# Patient Record
Sex: Female | Born: 1940 | Race: White | Hispanic: No | Marital: Married | Smoking: Never smoker
Health system: Southern US, Community
[De-identification: ages and names within clinical notes are randomized; demographics above are authoritative.]

## PROBLEM LIST (undated history)

## (undated) DIAGNOSIS — E7800 Pure hypercholesterolemia, unspecified: Secondary | ICD-10-CM

## (undated) DIAGNOSIS — H547 Unspecified visual loss: Secondary | ICD-10-CM

## (undated) DIAGNOSIS — I10 Essential (primary) hypertension: Secondary | ICD-10-CM

## (undated) HISTORY — DX: Pure hypercholesterolemia, unspecified: E78.00

## (undated) HISTORY — PX: TOTAL HIP ARTHROPLASTY: SHX124

## (undated) HISTORY — DX: Unspecified visual loss: H54.7

---

## 2017-08-31 ENCOUNTER — Ambulatory Visit (INDEPENDENT_AMBULATORY_CARE_PROVIDER_SITE_OTHER): Payer: Medicare Other | Admitting: Podiatry

## 2017-08-31 ENCOUNTER — Encounter: Payer: Self-pay | Admitting: Podiatry

## 2017-08-31 VITALS — BP 145/66 | HR 63

## 2017-08-31 DIAGNOSIS — M79675 Pain in left toe(s): Secondary | ICD-10-CM

## 2017-08-31 DIAGNOSIS — M79674 Pain in right toe(s): Secondary | ICD-10-CM

## 2017-08-31 DIAGNOSIS — L84 Corns and callosities: Secondary | ICD-10-CM

## 2017-08-31 DIAGNOSIS — B351 Tinea unguium: Secondary | ICD-10-CM

## 2017-08-31 NOTE — Progress Notes (Signed)
Complaint:  Visit Type: Patient presents  to my office for  preventative foot care services. Complaint: Patient states" my nails have grown long and thick and become painful to walk and wear shoes" She also needs her callus to be worked on.  She presents to the office coming from [place] and says she states her podiatrist every 9 weeks.   The patient presents for preventative foot care services.   Podiatric Exam: Vascular: dorsalis pedis and posterior tibial pulses are palpable bilateral. Capillary return is immediate. Temperature gradient is WNL. Skin turgor WNL  Sensorium: Normal Semmes Weinstein monofilament test. Normal tactile sensation bilaterally. Nail Exam: Pt has thick disfigured discolored nails with subungual debris noted bilateral entire nail hallux  Ulcer Exam: There is no evidence of ulcer or pre-ulcerative changes or infection. Orthopedic Exam: Muscle tone and strength are WNL. No limitations in general ROM. No crepitus or effusions noted. Foot type and digits show no abnormalities. Bony prominences are unremarkable. Skin: No Porokeratosis. No infection or ulcers.  Heel callus noted asymptomatic.  Diagnosis:  Onychomycosis, , Pain in right toe, pain in left toes  Treatment & Plan Procedures and Treatment: IE  Debride nails  X 2.  Debride heel callus with dremel.  Return Visit-Office Procedure: Patient instructed to return to the office for a follow up visit 10 weeks for continued evaluation and treatment. Patient will be reappointed with her husband.    Gardiner Barefoot DPM

## 2017-11-09 ENCOUNTER — Ambulatory Visit: Payer: BLUE CROSS/BLUE SHIELD | Admitting: Podiatry

## 2017-11-29 ENCOUNTER — Ambulatory Visit (INDEPENDENT_AMBULATORY_CARE_PROVIDER_SITE_OTHER): Payer: Medicare Other | Admitting: Podiatry

## 2017-11-29 ENCOUNTER — Encounter: Payer: Self-pay | Admitting: Podiatry

## 2017-11-29 DIAGNOSIS — M79675 Pain in left toe(s): Secondary | ICD-10-CM | POA: Diagnosis not present

## 2017-11-29 DIAGNOSIS — Q828 Other specified congenital malformations of skin: Secondary | ICD-10-CM | POA: Diagnosis not present

## 2017-11-29 DIAGNOSIS — M79674 Pain in right toe(s): Secondary | ICD-10-CM | POA: Diagnosis not present

## 2017-11-29 DIAGNOSIS — B351 Tinea unguium: Secondary | ICD-10-CM

## 2017-11-29 NOTE — Progress Notes (Signed)
She presents today placed on my schedule by Dr. Sharyon Cable with a chief complaint of painful elongated toenails.  Objective: Toenails are long thick yellow dystrophic-like mycotic and painful on palpation as well as multiple calluses plantar aspect of the forefoot and rear foot.  Assessment: Pain in limb secondary to porokeratosis and calluses as well as pain in limb secondary to onychomycosis.  Plan: Debridement of toenails and calluses bilateral.

## 2018-02-07 ENCOUNTER — Ambulatory Visit: Payer: BLUE CROSS/BLUE SHIELD | Admitting: Podiatry

## 2018-03-06 ENCOUNTER — Encounter: Payer: Self-pay | Admitting: Obstetrics and Gynecology

## 2018-03-07 ENCOUNTER — Encounter: Payer: Self-pay | Admitting: Obstetrics and Gynecology

## 2018-03-07 ENCOUNTER — Ambulatory Visit (INDEPENDENT_AMBULATORY_CARE_PROVIDER_SITE_OTHER): Payer: Medicare Other | Admitting: Obstetrics and Gynecology

## 2018-03-07 ENCOUNTER — Other Ambulatory Visit: Payer: Self-pay | Admitting: Obstetrics and Gynecology

## 2018-03-07 VITALS — BP 160/74 | HR 68 | Ht 62.0 in | Wt 194.9 lb

## 2018-03-07 DIAGNOSIS — Z1231 Encounter for screening mammogram for malignant neoplasm of breast: Secondary | ICD-10-CM

## 2018-03-07 DIAGNOSIS — Z780 Asymptomatic menopausal state: Secondary | ICD-10-CM

## 2018-03-07 DIAGNOSIS — Z01419 Encounter for gynecological examination (general) (routine) without abnormal findings: Secondary | ICD-10-CM

## 2018-03-07 DIAGNOSIS — N393 Stress incontinence (female) (male): Secondary | ICD-10-CM

## 2018-03-07 DIAGNOSIS — E668 Other obesity: Secondary | ICD-10-CM

## 2018-03-07 NOTE — Patient Instructions (Signed)
Health Maintenance for Postmenopausal Women Menopause is a normal process in which your reproductive ability comes to an end. This process happens gradually over a span of months to years, usually between the ages of 62 and 89. Menopause is complete when you have missed 12 consecutive menstrual periods. It is important to talk with your health care provider about some of the most common conditions that affect postmenopausal women, such as heart disease, cancer, and bone loss (osteoporosis). Adopting a healthy lifestyle and getting preventive care can help to promote your health and wellness. Those actions can also lower your chances of developing some of these common conditions. What should I know about menopause? During menopause, you may experience a number of symptoms, such as:  Moderate-to-severe hot flashes.  Night sweats.  Decrease in sex drive.  Mood swings.  Headaches.  Tiredness.  Irritability.  Memory problems.  Insomnia. Choosing to treat or not to treat menopausal changes is an individual decision that you make with your health care provider. What should I know about hormone replacement therapy and supplements? Hormone therapy products are effective for treating symptoms that are associated with menopause, such as hot flashes and night sweats. Hormone replacement carries certain risks, especially as you become older. If you are thinking about using estrogen or estrogen with progestin treatments, discuss the benefits and risks with your health care provider. What should I know about heart disease and stroke? Heart disease, heart attack, and stroke become more likely as you age. This may be due, in part, to the hormonal changes that your body experiences during menopause. These can affect how your body processes dietary fats, triglycerides, and cholesterol. Heart attack and stroke are both medical emergencies. There are many things that you can do to help prevent heart disease  and stroke:  Have your blood pressure checked at least every 1-2 years. High blood pressure causes heart disease and increases the risk of stroke.  If you are 79-72 years old, ask your health care provider if you should take aspirin to prevent a heart attack or a stroke.  Do not use any tobacco products, including cigarettes, chewing tobacco, or electronic cigarettes. If you need help quitting, ask your health care provider.  It is important to eat a healthy diet and maintain a healthy weight. ? Be sure to include plenty of vegetables, fruits, low-fat dairy products, and lean protein. ? Avoid eating foods that are high in solid fats, added sugars, or salt (sodium).  Get regular exercise. This is one of the most important things that you can do for your health. ? Try to exercise for at least 150 minutes each week. The type of exercise that you do should increase your heart rate and make you sweat. This is known as moderate-intensity exercise. ? Try to do strengthening exercises at least twice each week. Do these in addition to the moderate-intensity exercise.  Know your numbers.Ask your health care provider to check your cholesterol and your blood glucose. Continue to have your blood tested as directed by your health care provider.  What should I know about cancer screening? There are several types of cancer. Take the following steps to reduce your risk and to catch any cancer development as early as possible. Breast Cancer  Practice breast self-awareness. ? This means understanding how your breasts normally appear and feel. ? It also means doing regular breast self-exams. Let your health care provider know about any changes, no matter how small.  If you are 40 or  older, have a clinician do a breast exam (clinical breast exam or CBE) every year. Depending on your age, family history, and medical history, it may be recommended that you also have a yearly breast X-ray (mammogram).  If you  have a family history of breast cancer, talk with your health care provider about genetic screening.  If you are at high risk for breast cancer, talk with your health care provider about having an MRI and a mammogram every year.  Breast cancer (BRCA) gene test is recommended for women who have family members with BRCA-related cancers. Results of the assessment will determine the need for genetic counseling and BRCA1 and for BRCA2 testing. BRCA-related cancers include these types: ? Breast. This occurs in males or females. ? Ovarian. ? Tubal. This may also be called fallopian tube cancer. ? Cancer of the abdominal or pelvic lining (peritoneal cancer). ? Prostate. ? Pancreatic. Cervical, Uterine, and Ovarian Cancer Your health care provider may recommend that you be screened regularly for cancer of the pelvic organs. These include your ovaries, uterus, and vagina. This screening involves a pelvic exam, which includes checking for microscopic changes to the surface of your cervix (Pap test).  For women ages 21-65, health care providers may recommend a pelvic exam and a Pap test every three years. For women ages 39-65, they may recommend the Pap test and pelvic exam, combined with testing for human papilloma virus (HPV), every five years. Some types of HPV increase your risk of cervical cancer. Testing for HPV may also be done on women of any age who have unclear Pap test results.  Other health care providers may not recommend any screening for nonpregnant women who are considered low risk for pelvic cancer and have no symptoms. Ask your health care provider if a screening pelvic exam is right for you.  If you have had past treatment for cervical cancer or a condition that could lead to cancer, you need Pap tests and screening for cancer for at least 20 years after your treatment. If Pap tests have been discontinued for you, your risk factors (such as having a new sexual partner) need to be reassessed  to determine if you should start having screenings again. Some women have medical problems that increase the chance of getting cervical cancer. In these cases, your health care provider may recommend that you have screening and Pap tests more often.  If you have a family history of uterine cancer or ovarian cancer, talk with your health care provider about genetic screening.  If you have vaginal bleeding after reaching menopause, tell your health care provider.  There are currently no reliable tests available to screen for ovarian cancer. Lung Cancer Lung cancer screening is recommended for adults 57-50 years old who are at high risk for lung cancer because of a history of smoking. A yearly low-dose CT scan of the lungs is recommended if you:  Currently smoke.  Have a history of at least 30 pack-years of smoking and you currently smoke or have quit within the past 15 years. A pack-year is smoking an average of one pack of cigarettes per day for one year. Yearly screening should:  Continue until it has been 15 years since you quit.  Stop if you develop a health problem that would prevent you from having lung cancer treatment. Colorectal Cancer  This type of cancer can be detected and can often be prevented.  Routine colorectal cancer screening usually begins at age 12 and continues through  age 75.  If you have risk factors for colon cancer, your health care provider may recommend that you be screened at an earlier age.  If you have a family history of colorectal cancer, talk with your health care provider about genetic screening.  Your health care provider may also recommend using home test kits to check for hidden blood in your stool.  A small camera at the end of a tube can be used to examine your colon directly (sigmoidoscopy or colonoscopy). This is done to check for the earliest forms of colorectal cancer.  Direct examination of the colon should be repeated every 5-10 years until  age 75. However, if early forms of precancerous polyps or small growths are found or if you have a family history or genetic risk for colorectal cancer, you may need to be screened more often. Skin Cancer  Check your skin from head to toe regularly.  Monitor any moles. Be sure to tell your health care provider: ? About any new moles or changes in moles, especially if there is a change in a mole's shape or color. ? If you have a mole that is larger than the size of a pencil eraser.  If any of your family members has a history of skin cancer, especially at a young age, talk with your health care provider about genetic screening.  Always use sunscreen. Apply sunscreen liberally and repeatedly throughout the day.  Whenever you are outside, protect yourself by wearing long sleeves, pants, a wide-brimmed hat, and sunglasses. What should I know about osteoporosis? Osteoporosis is a condition in which bone destruction happens more quickly than new bone creation. After menopause, you may be at an increased risk for osteoporosis. To help prevent osteoporosis or the bone fractures that can happen because of osteoporosis, the following is recommended:  If you are 19-50 years old, get at least 1,000 mg of calcium and at least 600 mg of vitamin D per day.  If you are older than age 50 but younger than age 70, get at least 1,200 mg of calcium and at least 600 mg of vitamin D per day.  If you are older than age 70, get at least 1,200 mg of calcium and at least 800 mg of vitamin D per day. Smoking and excessive alcohol intake increase the risk of osteoporosis. Eat foods that are rich in calcium and vitamin D, and do weight-bearing exercises several times each week as directed by your health care provider. What should I know about how menopause affects my mental health? Depression may occur at any age, but it is more common as you become older. Common symptoms of depression include:  Low or sad  mood.  Changes in sleep patterns.  Changes in appetite or eating patterns.  Feeling an overall lack of motivation or enjoyment of activities that you previously enjoyed.  Frequent crying spells. Talk with your health care provider if you think that you are experiencing depression. What should I know about immunizations? It is important that you get and maintain your immunizations. These include:  Tetanus, diphtheria, and pertussis (Tdap) booster vaccine.  Influenza every year before the flu season begins.  Pneumonia vaccine.  Shingles vaccine. Your health care provider may also recommend other immunizations. This information is not intended to replace advice given to you by your health care provider. Make sure you discuss any questions you have with your health care provider. Document Released: 04/29/2005 Document Revised: 09/25/2015 Document Reviewed: 12/09/2014 Elsevier Interactive Patient Education    2019 [place].

## 2018-03-07 NOTE — Progress Notes (Signed)
ANNUAL PREVENTATIVE CARE GYNECOLOGY  ENCOUNTER NOTE  Subjective:       Cassandra Gill is a 77 y.o. 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 She has relocated from [place] to be closer to family. The patient is not sexually active. The patient is not taking hormone replacement therapy. Patient denies post-menopausal vaginal bleeding. The patient wears seatbelts: yes. The patient participates in regular exercise: no. Has the patient ever been transfused or tattooed?: no. The patient reports that there is not domestic violence in her life.  Current complaints: 1.  None    Gynecologic History No LMP recorded. Patient is postmenopausal. Contraception: post menopausal status Last Pap: last year, per patient. Results were: normal Last mammogram: last year,  per patient. Results were: normal Last Colonoscopy: several years ago (does not report more than 10 years has passed), was normal per patient.  Last Dexa Scan: reports it is up to date but cannot recall year performed.    Obstetric History OB History  Gravida Para Term Preterm AB Living  2 2 2     2   SAB TAB Ectopic Multiple Live Births          2    # Outcome Date GA Lbr Len/2nd Weight Sex Delivery Anes PTL Lv  2 Term 1965    F Vag-Spont   LIV  1 Term 4    F Vag-Spont   LIV    Past Medical History:  Diagnosis Date  . Blind   . High cholesterol     History reviewed. No pertinent family history.  History reviewed. No pertinent surgical history.  Social History   Socioeconomic History  . Marital status: Married    Spouse name: Not on file  . Number of children: Not on file  . Years of education: Not on file  . Highest education level: Not on file  Occupational History  . Not on file  Social Needs  . Financial resource strain: Not on file  . Food insecurity:    Worry: Not on file    Inability: Not on file  . Transportation needs:    Medical: Not on file    Non-medical: Not on file  Tobacco Use  . Smoking status: Never Smoker  . Smokeless tobacco: Never Used  Substance and Sexual Activity  . Alcohol use: Yes    Frequency: Never    Comment: socially  . Drug use: Never  . Sexual activity: Not Currently  Lifestyle  . Physical activity:    Days per week: Not on file    Minutes per session: Not on file  . Stress: Not on file  Relationships  . Social connections:    Talks on phone: Not on file    Gets together: Not on file    Attends religious service: Not on file    Active member of club or organization: Not on file    Attends meetings of clubs or organizations: Not on file    Relationship status: Not on file  . Intimate partner violence:    Fear of current or ex partner: Not on file    Emotionally abused: Not on file    Physically abused: Not on file    Forced sexual activity: Not on file  Other Topics Concern  . Not on file  Social History Narrative  . Not on file    Current Outpatient Medications on File Prior to Visit  Medication Sig Dispense  Refill  . simvastatin (ZOCOR) 20 MG tablet Take 20 mg by mouth daily.     No current facility-administered medications on file prior to visit.     No Known Allergies    Review of Systems ROS Review of Systems - General ROS: negative for - chills, fatigue, fever, hot flashes, night sweats, weight gain or weight loss Psychological ROS: negative for - anxiety, decreased libido, depression, mood swings, physical abuse or sexual abuse Ophthalmic ROS: negative for - blurry vision, eye pain or loss of vision ENT ROS: negative for - headaches, hearing change, visual changes or vocal changes Allergy and Immunology ROS: negative for - hives, itchy/watery eyes or seasonal allergies Hematological and Lymphatic ROS: negative for - bleeding problems, bruising, swollen lymph nodes or weight loss Endocrine ROS: negative for - galactorrhea, hair pattern changes, hot flashes, malaise/lethargy,  mood swings, palpitations, polydipsia/polyuria, skin changes, temperature intolerance or unexpected weight changes Breast ROS: negative for - new or changing breast lumps or nipple discharge Respiratory ROS: negative for - cough or shortness of breath Cardiovascular ROS: negative for - chest pain, irregular heartbeat, palpitations or shortness of breath Gastrointestinal ROS: no abdominal pain, change in bowel habits, or black or bloody stools Genito-Urinary ROS: no dysuria, trouble voiding, or hematuria.  Does report occasional urinary leakage with coughing, sneezing.  Musculoskeletal ROS: negative for - joint pain or joint stiffness Neurological ROS: negative for - bowel and bladder control changes Dermatological ROS: negative for rash and skin lesion changes   Objective:   BP (!) 160/74   Pulse 68   Ht 5\' 2"  (1.575 m)   Wt 194 lb 14.4 oz (88.4 kg)   BMI 35.65 kg/m  CONSTITUTIONAL: Well-developed, well-nourished female in no acute distress.  PSYCHIATRIC: Normal mood and affect. Normal behavior. Normal judgment and thought content. [place]: Alert and oriented to person, place, and time. Normal muscle tone coordination. No cranial nerve deficit noted. HENT:  Normocephalic, atraumatic, External right and left ear normal. Oropharynx is clear and moist EYES: Conjunctivae and EOM are normal. Pupils are equal, round, and reactive to light. No scleral icterus.  NECK: Normal range of motion, supple, no masses.  Normal thyroid.  SKIN: Skin is warm and dry. No rash noted. Not diaphoretic. No erythema. No pallor. CARDIOVASCULAR: Normal heart rate noted, regular rhythm, no murmur. RESPIRATORY: Clear to auscultation bilaterally. Effort and breath sounds normal, no problems with respiration noted. BREASTS: Symmetric in size. No masses, skin changes, nipple drainage, or lymphadenopathy. ABDOMEN: Soft, normal bowel sounds, no distention noted.  No tenderness, rebound or guarding.  BLADDER:  Normal PELVIC:  Bladder no bladder distension noted  Urethra: normal appearing urethra with no masses, tenderness or lesions  Vulva: normal appearing vulva with no masses, tenderness or lesions  Vagina: declined speculum  Exam but no masses present.   Cervix: declined speculum exam. But no CMT palpable.   Uterus: uterus is normal size, shape, consistency and nontender  Adnexa: normal adnexa in size, nontender and no masses  RV: External Exam NormaI, No Rectal Masses and Normal Sphincter tone  MUSCULOSKELETAL: Normal range of motion. No tenderness.  No cyanosis, clubbing, or edema.  2+ distal pulses. LYMPHATIC: No Axillary, Supraclavicular, or Inguinal Adenopathy.   Labs: No results found for: WBC, HGB, HCT, MCV, PLT  No results found for: CREATININE, BUN, NA, K, CL, CO2  No results found for: ALT, AST, GGT, ALKPHOS, BILITOT  No results found for: CHOL, HDL, LDLCALC, LDLDIRECT, TRIG, CHOLHDL  No results found for: TSH  No results found for: HGBA1C   Assessment:   Annual gynecologic examination 77 y.o. Moderate obesity Mild stress incontinence Menopausal  Plan:  Pap: Not needed.  Patient is beyond age of requiring pap smears (age 24).   Contraception: post menopausal status Mammogram: Not Indicated as patient is beyond age of screening age 29) with no risk factors.  Stool Guaiac Testing:  Not Ordered. Per patient, has been less than 10 years since colonoscopy.  Labs: Reports labs have recently performed with a PCP she has established in the area.  Routine preventative health maintenance measures emphasized: Exercise/Diet/Weight control, Tobacco Warnings, Alcohol/Substance use risks and Stress Management Mild urinary stress incontinence. Patient notes that symptoms are "just a part of life", not very bothersome.   Declines flu vaccine.  Return to [place], MD  [facility]

## 2018-03-07 NOTE — Progress Notes (Signed)
Pt is present today as a NP for an annual exam. Pt stated that she was doing well no complaints.

## 2018-03-10 DIAGNOSIS — Z780 Asymptomatic menopausal state: Secondary | ICD-10-CM | POA: Insufficient documentation

## 2018-03-10 DIAGNOSIS — N393 Stress incontinence (female) (male): Secondary | ICD-10-CM | POA: Insufficient documentation

## 2018-03-10 DIAGNOSIS — E668 Other obesity: Secondary | ICD-10-CM | POA: Insufficient documentation

## 2018-03-10 DIAGNOSIS — E669 Obesity, unspecified: Secondary | ICD-10-CM | POA: Insufficient documentation

## 2018-04-04 ENCOUNTER — Ambulatory Visit
Admission: RE | Admit: 2018-04-04 | Discharge: 2018-04-04 | Disposition: A | Discharge status: Home / Self Care | Payer: Medicare Other | Source: Ambulatory Visit | Attending: Obstetrics and Gynecology | Admitting: Obstetrics and Gynecology

## 2018-04-04 DIAGNOSIS — Z1231 Encounter for screening mammogram for malignant neoplasm of breast: Secondary | ICD-10-CM | POA: Insufficient documentation

## 2018-05-18 ENCOUNTER — Other Ambulatory Visit: Payer: Self-pay | Admitting: Internal Medicine

## 2018-05-18 DIAGNOSIS — R0989 Other specified symptoms and signs involving the circulatory and respiratory systems: Secondary | ICD-10-CM

## 2018-05-25 ENCOUNTER — Ambulatory Visit
Admission: RE | Admit: 2018-05-25 | Discharge: 2018-05-25 | Disposition: A | Discharge status: Home / Self Care | Payer: Medicare Other | Source: Ambulatory Visit | Attending: Internal Medicine | Admitting: Internal Medicine

## 2018-05-25 ENCOUNTER — Other Ambulatory Visit: Payer: Self-pay

## 2018-05-25 ENCOUNTER — Ambulatory Visit: Payer: PRIVATE HEALTH INSURANCE

## 2018-05-25 DIAGNOSIS — R0989 Other specified symptoms and signs involving the circulatory and respiratory systems: Secondary | ICD-10-CM | POA: Insufficient documentation

## 2019-03-04 ENCOUNTER — Other Ambulatory Visit: Payer: Self-pay | Admitting: Obstetrics and Gynecology

## 2019-03-13 ENCOUNTER — Encounter: Payer: Self-pay | Admitting: Obstetrics and Gynecology

## 2019-03-13 ENCOUNTER — Ambulatory Visit (INDEPENDENT_AMBULATORY_CARE_PROVIDER_SITE_OTHER): Payer: Medicare Other | Admitting: Obstetrics and Gynecology

## 2019-03-13 ENCOUNTER — Other Ambulatory Visit: Payer: Self-pay

## 2019-03-13 VITALS — BP 145/68 | HR 73 | Ht 62.0 in | Wt 190.4 lb

## 2019-03-13 DIAGNOSIS — Z01419 Encounter for gynecological examination (general) (routine) without abnormal findings: Secondary | ICD-10-CM

## 2019-03-13 DIAGNOSIS — Z780 Asymptomatic menopausal state: Secondary | ICD-10-CM

## 2019-03-13 DIAGNOSIS — E668 Other obesity: Secondary | ICD-10-CM

## 2019-03-13 DIAGNOSIS — N393 Stress incontinence (female) (male): Secondary | ICD-10-CM | POA: Diagnosis not present

## 2019-03-13 NOTE — Progress Notes (Signed)
ANNUAL PREVENTATIVE CARE GYNECOLOGY  ENCOUNTER NOTE  Subjective:       Cassandra Gill is a 78 y.o. 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 The patient is not sexually active. The patient is not taking hormone replacement therapy. Patient denies post-menopausal vaginal bleeding. The patient wears seatbelts: yes. The patient participates in regular exercise: no. Has the patient ever been transfused or tattooed?: no.    Current complaints: 1.  None    Gynecologic History No LMP recorded. Patient is postmenopausal. Contraception: post menopausal status.  Last Pap: last year, per patient. Results were: normal.  Last mammogram: 04/04/2018.  Results were: normal.  Last Colonoscopy: 2013.  Results were normal.  Last Dexa Scan: reports it is up to date but cannot recall year performed.    Obstetric History OB History  Gravida Para Term Preterm AB Living  2 2 2     2   SAB TAB Ectopic Multiple Live Births          2    # Outcome Date GA Lbr Len/2nd Weight Sex Delivery Anes PTL Lv  2 Term 1965    F Vag-Spont   LIV  1 Term 58    F Vag-Spont   LIV    Past Medical History:  Diagnosis Date  . Blind   . High cholesterol     History reviewed. No pertinent family history.  History reviewed. No pertinent surgical history.  Social History   Socioeconomic History  . Marital status: Married    Spouse name: Not on file  . Number of children: Not on file  . Years of education: Not on file  . Highest education level: Not on file  Occupational History  . Not on file  Tobacco Use  . Smoking status: Never Smoker  . Smokeless tobacco: Never Used  Substance and Sexual Activity  . Alcohol use: Yes    Comment: socially/occass  . Drug use: Never  . Sexual activity: Not Currently  Other Topics Concern  . Not on file  Social History Narrative  . Not on file   Social Determinants of Health   Financial Resource Strain:   .  Difficulty of Paying Living Expenses: Not on file  Food Insecurity:   . Worried About Charity fundraiser in the Last Year: Not on file  . Ran Out of Food in the Last Year: Not on file  Transportation Needs:   . Lack of Transportation (Medical): Not on file  . Lack of Transportation (Non-Medical): Not on file  Physical Activity:   . Days of Exercise per Week: Not on file  . Minutes of Exercise per Session: Not on file  Stress:   . Feeling of Stress : Not on file  Social Connections:   . Frequency of Communication with Friends and Family: Not on file  . Frequency of Social Gatherings with Friends and Family: Not on file  . Attends Religious Services: Not on file  . Active Member of Clubs or Organizations: Not on file  . Attends Archivist Meetings: Not on file  . Marital Status: Not on file  Intimate Partner Violence:   . Fear of Current or Ex-Partner: Not on file  . Emotionally Abused: Not on file  . Physically Abused: Not on file  . Sexually Abused: Not on file    Current Outpatient Medications on File Prior to Visit  Medication Sig Dispense Refill  . simvastatin (ZOCOR) 20  MG tablet Take 20 mg by mouth daily.     No current facility-administered medications on file prior to visit.    No Known Allergies    Review of Systems ROS Review of Systems - General ROS: negative for - chills, fatigue, fever, hot flashes, night sweats, weight gain or weight loss Psychological ROS: negative for - anxiety, decreased libido, depression, mood swings, physical abuse or sexual abuse Ophthalmic ROS: negative for - blurry vision, eye pain or loss of vision ENT ROS: negative for - headaches, hearing change, visual changes or vocal changes Allergy and Immunology ROS: negative for - hives, itchy/watery eyes or seasonal allergies Hematological and Lymphatic ROS: negative for - bleeding problems, bruising, swollen lymph nodes or weight loss Endocrine ROS: negative for - galactorrhea,  hair pattern changes, hot flashes, malaise/lethargy, mood swings, palpitations, polydipsia/polyuria, skin changes, temperature intolerance or unexpected weight changes Breast ROS: negative for - new or changing breast lumps or nipple discharge Respiratory ROS: negative for - cough or shortness of breath Cardiovascular ROS: negative for - chest pain, irregular heartbeat, palpitations or shortness of breath Gastrointestinal ROS: no abdominal pain, change in bowel habits, or black or bloody stools Genito-Urinary ROS: no dysuria, trouble voiding, or hematuria.  Still report occasional urinary leakage with coughing, sneezing.  Wears urinary pad daily just in case.  Musculoskeletal ROS: negative for - joint pain or joint stiffness Neurological ROS: negative for - bowel and bladder control changes Dermatological ROS: negative for rash and skin lesion changes   Objective:   BP (!) 145/68   Pulse 73   Ht 5\' 2"  (1.575 m)   Wt 190 lb 6.4 oz (86.4 kg)   BMI 34.82 kg/m  CONSTITUTIONAL: Well-developed, well-nourished female in no acute distress. Mild obesity PSYCHIATRIC: Normal mood and affect. Normal behavior. Normal judgment and thought content. [place]: Alert and oriented to person, place, and time. Normal muscle tone coordination. No cranial nerve deficit noted. HENT:  Normocephalic, atraumatic, External right and left ear normal. Oropharynx is clear and moist EYES: Conjunctivae and EOM are normal. Pupils are equal, round, and reactive to light. No scleral icterus.  NECK: Normal range of motion, supple, no masses.  Normal thyroid.  SKIN: Skin is warm and dry. No rash noted. Not diaphoretic. No erythema. No pallor. CARDIOVASCULAR: Normal heart rate noted, regular rhythm, no murmur. RESPIRATORY: Clear to auscultation bilaterally. Effort and breath sounds normal, no problems with respiration noted. BREASTS: Symmetric in size. No masses, skin changes, nipple drainage, or lymphadenopathy. ABDOMEN:  Soft, normal bowel sounds, no distention noted.  No tenderness, rebound or guarding.  BLADDER: Normal PELVIC:  Bladder no bladder distension noted  Urethra: normal appearing urethra with no masses, tenderness or lesions  Vulva: normal appearing vulva with no masses, tenderness or lesions  Vagina: declined speculum  Exam but no masses present.   Cervix: declined speculum exam. But no CMT palpable.   Uterus: uterus is normal size, shape, consistency and nontender  Adnexa: normal adnexa in size, nontender and no masses  RV: External Exam NormaI, No Rectal Masses and Normal Sphincter tone  MUSCULOSKELETAL: Normal range of motion. No tenderness.  No cyanosis, clubbing, or edema.  2+ distal pulses. LYMPHATIC: No Axillary, Supraclavicular, or Inguinal Adenopathy.   Labs: No results found for: WBC, HGB, HCT, MCV, PLT  No results found for: CREATININE, BUN, NA, K, CL, CO2  No results found for: ALT, AST, GGT, ALKPHOS, BILITOT  No results found for: CHOL, HDL, LDLCALC, LDLDIRECT, TRIG, CHOLHDL  No results found  for: TSH  No results found for: HGBA1C   Assessment:   Annual gynecologic examination 78 y.o. Moderate obesity Mild stress incontinence Menopausal  Plan:  Pap: Not needed.  Patient is beyond age of requiring pap smears (age 63).   Contraception: post menopausal status Mammogram: Not Indicated as patient is beyond age of screening age (43) with no risk factors.  Stool Guaiac Testing:  Not Ordered. Up to date.  Labs: Labs are done by PCP Routine preventative health maintenance measures emphasized: Exercise/Diet/Weight control, Tobacco Warnings, Alcohol/Substance use risks and Stress Management Mild urinary stress incontinence. Wears urinary pads. Continues to decline any intervention. Declines flu vaccine.  Return to [place], MD  [facility]

## 2019-03-13 NOTE — Progress Notes (Signed)
Pt present for annual exam. Pt stated that she was doing well no problems at this time.  Pt had flu vaccine 01/25/19.

## 2019-03-13 NOTE — Patient Instructions (Signed)
Health Maintenance for Postmenopausal Women Menopause is a normal process in which your ability to get pregnant comes to an end. This process happens slowly over many months or years, usually between the ages of 48 and 55. Menopause is complete when you have missed your menstrual periods for 12 months. It is important to talk with your health care provider about some of the most common conditions that affect women after menopause (postmenopausal women). These include heart disease, cancer, and bone loss (osteoporosis). Adopting a healthy lifestyle and getting preventive care can help to promote your health and wellness. The actions you take can also lower your chances of developing some of these common conditions. What should I know about menopause? During menopause, you may get a number of symptoms, such as:  Hot flashes. These can be moderate or severe.  Night sweats.  Decrease in sex drive.  Mood swings.  Headaches.  Tiredness.  Irritability.  Memory problems.  Insomnia. Choosing to treat or not to treat these symptoms is a decision that you make with your health care provider. Do I need hormone replacement therapy?  Hormone replacement therapy is effective in treating symptoms that are caused by menopause, such as hot flashes and night sweats.  Hormone replacement carries certain risks, especially as you become older. If you are thinking about using estrogen or estrogen with progestin, discuss the benefits and risks with your health care provider. What is my risk for heart disease and stroke? The risk of heart disease, heart attack, and stroke increases as you age. One of the causes may be a change in the body's hormones during menopause. This can affect how your body uses dietary fats, triglycerides, and cholesterol. Heart attack and stroke are medical emergencies. There are many things that you can do to help prevent heart disease and stroke. Watch your blood pressure  High  blood pressure causes heart disease and increases the risk of stroke. This is more likely to develop in people who have high blood pressure readings, are of African descent, or are overweight.  Have your blood pressure checked: ? Every 3-5 years if you are 18-39 years of age. ? Every year if you are 40 years old or older. Eat a healthy diet   Eat a diet that includes plenty of vegetables, fruits, low-fat dairy products, and lean protein.  Do not eat a lot of foods that are high in solid fats, added sugars, or sodium. Get regular exercise Get regular exercise. This is one of the most important things you can do for your health. Most adults should:  Try to exercise for at least 150 minutes each week. The exercise should increase your heart rate and make you sweat (moderate-intensity exercise).  Try to do strengthening exercises at least twice each week. Do these in addition to the moderate-intensity exercise.  Spend less time sitting. Even light physical activity can be beneficial. Other tips  Work with your health care provider to achieve or maintain a healthy weight.  Do not use any products that contain nicotine or tobacco, such as cigarettes, e-cigarettes, and chewing tobacco. If you need help quitting, ask your health care provider.  Know your numbers. Ask your health care provider to check your cholesterol and your blood sugar (glucose). Continue to have your blood tested as directed by your health care provider. Do I need screening for cancer? Depending on your health history and family history, you may need to have cancer screening at different stages of your life. This   may include screening for:  Breast cancer.  Cervical cancer.  Lung cancer.  Colorectal cancer. What is my risk for osteoporosis? After menopause, you may be at increased risk for osteoporosis. Osteoporosis is a condition in which bone destruction happens more quickly than new bone creation. To help prevent  osteoporosis or the bone fractures that can happen because of osteoporosis, you may take the following actions:  If you are 83-73 years old, get at least 1,000 mg of calcium and at least 600 mg of vitamin D per day.  If you are older than age 47 but younger than age 17, get at least 1,200 mg of calcium and at least 600 mg of vitamin D per day.  If you are older than age 54, get at least 1,200 mg of calcium and at least 800 mg of vitamin D per day. Smoking and drinking excessive alcohol increase the risk of osteoporosis. Eat foods that are rich in calcium and vitamin D, and do weight-bearing exercises several times each week as directed by your health care provider. How does menopause affect my mental health? Depression may occur at any age, but it is more common as you become older. Common symptoms of depression include:  Low or sad mood.  Changes in sleep patterns.  Changes in appetite or eating patterns.  Feeling an overall lack of motivation or enjoyment of activities that you previously enjoyed.  Frequent crying spells. Talk with your health care provider if you think that you are experiencing depression. General instructions See your health care provider for regular wellness exams and vaccines. This may include:  Scheduling regular health, dental, and eye exams.  Getting and maintaining your vaccines. These include: ? Influenza vaccine. Get this vaccine each year before the flu season begins. ? Pneumonia vaccine. ? Shingles vaccine. ? Tetanus, diphtheria, and pertussis (Tdap) booster vaccine. Your health care provider may also recommend other immunizations. Tell your health care provider if you have ever been abused or do not feel safe at home. Summary  Menopause is a normal process in which your ability to get pregnant comes to an end.  This condition causes hot flashes, night sweats, decreased interest in sex, mood swings, headaches, or lack of sleep.  Treatment for this  condition may include hormone replacement therapy.  Take actions to keep yourself healthy, including exercising regularly, eating a healthy diet, watching your weight, and checking your blood pressure and blood sugar levels.  Get screened for cancer and depression. Make sure that you are up to date with all your vaccines. This information is not intended to replace advice given to you by your health care provider. Make sure you discuss any questions you have with your health care provider. Document Released: 04/29/2005 Document Revised: 02/28/2018 Document Reviewed: 02/28/2018 Elsevier Patient Education  2020 [place] Breast self-awareness means being familiar with how your breasts look and feel. It involves checking your breasts regularly and reporting any changes to your health care provider. Practicing breast self-awareness is important. Sometimes changes may not be harmful (are benign), but sometimes a change in your breasts can be a sign of a serious medical problem. It is important to learn how to do this procedure correctly so that you can catch problems early, when treatment is more likely to be successful. All women should practice breast self-awareness, including women who have had breast implants. What you need:  A mirror.  A well-lit room. How to do a breast self-exam  A breast self-exam is one way to learn what is normal for your breasts and whether your breasts are changing. To do a breast self-exam: Look for changes  1. Remove all the clothing above your waist. 2. Stand in front of a mirror in a room with good lighting. 3. Put your hands on your hips. 4. Push your hands firmly downward. 5. Compare your breasts in the mirror. Look for differences between them (asymmetry), such as: ? Differences in shape. ? Differences in size. ? Puckers, dips, and bumps in one breast and not the other. 6. Look at each breast for changes in the skin, such  as: ? Redness. ? Scaly areas. 7. Look for changes in your nipples, such as: ? Discharge. ? Bleeding. ? Dimpling. ? Redness. ? A change in position. Feel for changes Carefully feel your breasts for lumps and changes. It is best to do this while lying on your back on the floor, and again while sitting or standing in the tub or shower with soapy water on your skin. Feel each breast in the following way: 1. Place the arm on the side of the breast you are examining above your head. 2. Feel your breast with the other hand. 3. Start in the nipple area and make -inch (2 cm) overlapping circles to feel your breast. Use the pads of your three middle fingers to do this. Apply light pressure, then medium pressure, then firm pressure. The light pressure will allow you to feel the tissue closest to the skin. The medium pressure will allow you to feel the tissue that is a little deeper. The firm pressure will allow you to feel the tissue close to the ribs. 4. Continue the overlapping circles, moving downward over the breast until you feel your ribs below your breast. 5. Move one finger-width toward the center of the body. Continue to use the -inch (2 cm) overlapping circles to feel your breast as you move slowly up toward your collarbone. 6. Continue the up-and-down exam using all three pressures until you reach your armpit.  Write down what you find Writing down what you find can help you remember what to discuss with your health care provider. Write down:  What is normal for each breast.  Any changes that you find in each breast, including: ? The kind of changes you find. ? Any pain or tenderness. ? Size and location of any lumps.  Where you are in your menstrual cycle, if you are still menstruating. General tips and recommendations  Examine your breasts every month.  If you are breastfeeding, the best time to examine your breasts is after a feeding or after using a breast pump.  If you  menstruate, the best time to examine your breasts is 5-7 days after your period. Breasts are generally lumpier during menstrual periods, and it may be more difficult to notice changes.  With time and practice, you will become more familiar with the variations in your breasts and more comfortable with the exam. Contact a health care provider if you:  See a change in the shape or size of your breasts or nipples.  See a change in the skin of your breast or nipples, such as a reddened or scaly area.  Have unusual discharge from your nipples.  Find a lump or thick area that was not there before.  Have pain in your breasts.  Have any concerns related to your breast health. Summary  Breast self-awareness includes looking for physical changes in  your breasts, as well as feeling for any changes within your breasts.  Breast self-awareness should be performed in front of a mirror in a well-lit room.  You should examine your breasts every month. If you menstruate, the best time to examine your breasts is 5-7 days after your menstrual period.  Let your health care provider know of any changes you notice in your breasts, including changes in size, changes on the skin, pain or tenderness, or unusual fluid from your nipples. This information is not intended to replace advice given to you by your health care provider. Make sure you discuss any questions you have with your health care provider. Document Released: 03/07/2005 Document Revised: 10/24/2017 Document Reviewed: 10/24/2017 Elsevier Patient Education  2020 [employer].

## 2019-03-18 ENCOUNTER — Other Ambulatory Visit: Payer: Self-pay | Admitting: Obstetrics and Gynecology

## 2019-03-18 DIAGNOSIS — Z1231 Encounter for screening mammogram for malignant neoplasm of breast: Secondary | ICD-10-CM

## 2019-04-11 ENCOUNTER — Telehealth: Payer: Self-pay | Admitting: Surgical

## 2019-04-11 NOTE — Telephone Encounter (Signed)
Patient called and stated that medicare called to let her know that her visit with Dr. Marcelline Mates on 03/13/2019 was denied. They are wanting her to file an appeal. Patient would like to know if codes could be changed to make it go through.

## 2019-04-18 NOTE — Telephone Encounter (Signed)
Pt aware all dx codes seem correct. Informed pt that medicare typically pays for [facility] every other year.   Pt is in the appeal process. She will call me back if I can help further.

## 2019-05-01 ENCOUNTER — Ambulatory Visit
Admission: RE | Admit: 2019-05-01 | Discharge: 2019-05-01 | Disposition: A | Discharge status: Home / Self Care | Payer: Medicare Other | Source: Ambulatory Visit | Attending: Obstetrics and Gynecology | Admitting: Obstetrics and Gynecology

## 2019-05-01 DIAGNOSIS — Z1231 Encounter for screening mammogram for malignant neoplasm of breast: Secondary | ICD-10-CM | POA: Insufficient documentation

## 2019-05-07 ENCOUNTER — Telehealth: Payer: Self-pay | Admitting: Obstetrics and Gynecology

## 2019-05-07 NOTE — Telephone Encounter (Signed)
Pt called in and stated that she wanted to know the results of her mammogram. Pt is requesting a  Call back from the nurse. Please advise

## 2019-05-23 NOTE — Telephone Encounter (Signed)
Pt called and is aware of test results.

## 2019-06-19 ENCOUNTER — Telehealth: Payer: Self-pay | Admitting: Obstetrics and Gynecology

## 2019-06-19 NOTE — Telephone Encounter (Signed)
Pt called in and stated that she was seen 03/13/2019. The pt insuance will not cover her visit because it was coded wrong. It needed to be coded as a MEDICARE  wellness exam. The pt is requesting a call back. Please advise

## 2019-06-20 NOTE — Telephone Encounter (Signed)
Medicare will not cover her AE. She is not sure why.  Will try and reach out to [facility] to get this changed.   Will ask to remove [facility] code.

## 2019-07-02 NOTE — Telephone Encounter (Signed)
Pt aware Gayleen Orem still working on this.  Will f/u with pt when I get more info.  Pt appreciative of call.

## 2019-08-12 NOTE — Telephone Encounter (Signed)
  Pt aware of Stacy's response below. Pt appreciative of the update.     Hi Murlean Seelye, Per coder review, I corrected [id number] to [phone] and resubmitted the claim back to Medicare.

## 2020-01-15 ENCOUNTER — Encounter
Admission: RE | Admit: 2020-01-15 | Discharge: 2020-01-15 | Disposition: A | Discharge status: Home / Self Care | Payer: Medicare Other | Source: Ambulatory Visit | Attending: Internal Medicine | Admitting: Internal Medicine

## 2020-01-22 ENCOUNTER — Encounter: Admission: RE | Payer: Self-pay | Source: Home / Self Care

## 2020-01-22 ENCOUNTER — Inpatient Hospital Stay: Admission: RE | Admit: 2020-01-22 | Payer: Medicare Other | Source: Home / Self Care | Admitting: Orthopedic Surgery

## 2020-01-22 SURGERY — ARTHROPLASTY, HIP, TOTAL, ANTERIOR APPROACH
Anesthesia: Spinal | Site: Hip | Laterality: Right

## 2020-01-25 DIAGNOSIS — Z96649 Presence of unspecified artificial hip joint: Secondary | ICD-10-CM | POA: Insufficient documentation

## 2020-03-18 ENCOUNTER — Encounter: Payer: Medicare Other | Admitting: Obstetrics and Gynecology

## 2020-03-25 ENCOUNTER — Other Ambulatory Visit: Payer: Self-pay | Admitting: Obstetrics and Gynecology

## 2020-03-25 ENCOUNTER — Other Ambulatory Visit: Payer: Self-pay | Admitting: Internal Medicine

## 2020-03-25 DIAGNOSIS — Z1231 Encounter for screening mammogram for malignant neoplasm of breast: Secondary | ICD-10-CM

## 2020-05-01 ENCOUNTER — Ambulatory Visit
Admission: RE | Admit: 2020-05-01 | Discharge: 2020-05-01 | Disposition: A | Discharge status: Home / Self Care | Payer: Medicare Other | Source: Ambulatory Visit | Attending: Internal Medicine | Admitting: Internal Medicine

## 2020-05-01 ENCOUNTER — Other Ambulatory Visit: Payer: Self-pay

## 2020-05-01 DIAGNOSIS — Z1231 Encounter for screening mammogram for malignant neoplasm of breast: Secondary | ICD-10-CM | POA: Insufficient documentation

## 2021-04-05 ENCOUNTER — Other Ambulatory Visit: Payer: Self-pay | Admitting: Obstetrics and Gynecology

## 2021-04-05 DIAGNOSIS — Z1231 Encounter for screening mammogram for malignant neoplasm of breast: Secondary | ICD-10-CM

## 2021-05-07 ENCOUNTER — Other Ambulatory Visit: Payer: Self-pay

## 2021-05-07 ENCOUNTER — Ambulatory Visit
Admission: RE | Admit: 2021-05-07 | Discharge: 2021-05-07 | Disposition: A | Discharge status: Home / Self Care | Payer: Medicare Other | Source: Ambulatory Visit | Attending: Obstetrics and Gynecology | Admitting: Obstetrics and Gynecology

## 2021-05-07 DIAGNOSIS — Z1231 Encounter for screening mammogram for malignant neoplasm of breast: Secondary | ICD-10-CM | POA: Insufficient documentation

## 2021-05-28 ENCOUNTER — Encounter: Payer: Medicare Other | Admitting: Obstetrics and Gynecology

## 2021-06-15 ENCOUNTER — Other Ambulatory Visit: Payer: Self-pay | Admitting: Internal Medicine

## 2021-06-15 ENCOUNTER — Other Ambulatory Visit (HOSPITAL_COMMUNITY): Payer: Self-pay | Admitting: Internal Medicine

## 2021-06-15 DIAGNOSIS — R0989 Other specified symptoms and signs involving the circulatory and respiratory systems: Secondary | ICD-10-CM

## 2021-06-29 ENCOUNTER — Ambulatory Visit
Admission: RE | Admit: 2021-06-29 | Discharge: 2021-06-29 | Disposition: A | Discharge status: Home / Self Care | Payer: Medicare Other | Source: Ambulatory Visit | Attending: Internal Medicine | Admitting: Internal Medicine

## 2021-06-29 DIAGNOSIS — R0989 Other specified symptoms and signs involving the circulatory and respiratory systems: Secondary | ICD-10-CM | POA: Insufficient documentation

## 2021-07-12 NOTE — Patient Instructions (Signed)
Breast Self-Awareness ?Breast self-awareness is knowing how your breasts look and feel. You need to: ?Check your breasts on a regular basis. ?Tell your doctor about any changes. ?Become familiar with the look and feel of your breasts. This can help you catch a breast problem while it is still small and can be treated. You should do breast self-exams even if you have breast implants. ?What you need: ?A mirror. ?A well-lit room. ?A pillow or other soft object. ?How to do a breast self-exam ?Follow these steps to do a breast self-exam: ?Look for changes ? ?Take off all the clothes above your waist. ?Stand in front of a mirror in a room with good lighting. ?Put your hands down at your sides. ?Compare your breasts in the mirror. Look for any difference between them, such as: ?A difference in shape. ?A difference in size. ?Wrinkles, dips, and bumps in one breast and not the other. ?Look at each breast for changes in the skin, such as: ?Redness. ?Scaly areas. ?Skin that has gotten thicker. ?Dimpling. ?Open sores (ulcers). ?Look for changes in your nipples, such as: ?Fluid coming out of a nipple. ?Fluid around a nipple. ?Bleeding. ?Dimpling. ?Redness. ?A nipple that looks pushed in (retracted), or that has changed position. ?Feel for changes ?Lie on your back. ?Feel each breast. To do this: ?Pick a breast to feel. ?Place a pillow under the shoulder closest to that breast. Put the arm closest to that breast behind your head. ?Feel the nipple area of that breast using the hand of your other arm. Feel the area with the pads of your three middle fingers by making small circles with your fingers. Use light, medium, and firm pressure. ?Continue the overlapping circles, moving downward over the breast. Keep making circles with your fingers. Stop when you feel your ribs. ?Start making circles with your fingers again, this time going upward until you reach your collarbone. ?Then, make circles outward across your breast and into your  armpit area. ?Squeeze your nipple. Check for discharge and lumps. ?Repeat these steps to check your other breast. ?Sit or stand in the tub or shower. ?With soapy water on your skin, feel each breast the same way you did when you were lying down. ?Write down what you find ?Writing down what you find can help you remember what to tell your doctor. Write down: ?What is normal for each breast. ?Any changes you find in each breast. These include: ?The kind of changes you find. ?A tender or painful breast. ?Any lump you find. Write down its size and where it is. ?When you last had your monthly period (menstrual cycle). ?General tips ?If you are breastfeeding, the best time to check your breasts is after you feed your baby or after you use a breast pump. ?If you get monthly bleeding, the best time to check your breasts is 5-7 days after your monthly cycle ends. ?With time, you will become comfortable with the self-exam. You will also start to know if there are changes in your breasts. ?Contact a doctor if: ?You see a change in the shape or size of your breasts or nipples. ?You see a change in the skin of your breast or nipples, such as red or scaly skin. ?You have fluid coming from your nipples that is not normal. ?You find a new lump or thick area. ?You have breast pain. ?You have any concerns about your breast health. ?Summary ?Breast self-awareness includes looking for changes in your breasts and feeling for changes   within your breasts. ?You should do breast self-awareness in front of a mirror in a well-lit room. ?If you get monthly periods (menstrual cycles), the best time to check your breasts is 5-7 days after your period ends. ?Tell your doctor about any changes you see in your breasts. Changes include changes in size, changes on the skin, painful or tender breasts, or fluid from your nipples that is not normal. ?This information is not intended to replace advice given to you by your health care provider. Make sure  you discuss any questions you have with your health care provider. ?Document Revised: 01/07/2021 Document Reviewed: 01/07/2021 ?Elsevier Patient Education ? [place]. ?Preventive Care 56 Years and Older, Female ?Preventive care refers to lifestyle choices and visits with your health care provider that can promote health and wellness. Preventive care visits are also called wellness exams. ?What can I expect for my preventive care visit? ?Counseling ?Your health care provider may ask you questions about your: ?Medical history, including: ?Past medical problems. ?Family medical history. ?Pregnancy and menstrual history. ?History of falls. ?Current health, including: ?Memory and ability to understand (cognition). ?Emotional well-being. ?Home life and relationship well-being. ?Sexual activity and sexual health. ?Lifestyle, including: ?Alcohol, nicotine or tobacco, and drug use. ?Access to firearms. ?Diet, exercise, and sleep habits. ?Work and work Statistician. ?Sunscreen use. ?Safety issues such as seatbelt and bike helmet use. ?Physical exam ?Your health care provider will check your: ?Height and weight. These may be used to calculate your BMI (body mass index). BMI is a measurement that tells if you are at a healthy weight. ?Waist circumference. This measures the distance around your waistline. This measurement also tells if you are at a healthy weight and may help predict your risk of certain diseases, such as type 2 diabetes and high blood pressure. ?Heart rate and blood pressure. ?Body temperature. ?Skin for abnormal spots. ?What immunizations do I need? ? ?Vaccines are usually given at various ages, according to a schedule. Your health care provider will recommend vaccines for you based on your age, medical history, and lifestyle or other factors, such as travel or where you work. ?What tests do I need? ?Screening ?Your health care provider may recommend screening tests for certain conditions. This may  include: ?Lipid and cholesterol levels. ?Hepatitis C test. ?Hepatitis B test. ?HIV (human immunodeficiency virus) test. ?STI (sexually transmitted infection) testing, if you are at risk. ?Lung cancer screening. ?Colorectal cancer screening. ?Diabetes screening. This is done by checking your blood sugar (glucose) after you have not eaten for a while (fasting). ?Mammogram. Talk with your health care provider about how often you should have regular mammograms. ?BRCA-related cancer screening. This may be done if you have a family history of breast, ovarian, tubal, or peritoneal cancers. ?Bone density scan. This is done to screen for osteoporosis. ?Talk with your health care provider about your test results, treatment options, and if necessary, the need for more tests. ?Follow these instructions at home: ?Eating and drinking ? ?Eat a diet that includes fresh fruits and vegetables, whole grains, lean protein, and low-fat dairy products. Limit your intake of foods with high amounts of sugar, saturated fats, and salt. ?Take vitamin and mineral supplements as recommended by your health care provider. ?Do not drink alcohol if your health care provider tells you not to drink. ?If you drink alcohol: ?Limit how much you have to 0-1 drink a day. ?Know how much alcohol is in your drink. In the [place] one drink equals one 12 oz  bottle of beer (355 mL), one 5 oz glass of wine (148 mL), or one 1? oz glass of hard liquor (44 mL). ?Lifestyle ?Brush your teeth every morning and night with fluoride toothpaste. Floss one time each day. ?Exercise for at least 30 minutes 5 or more days each week. ?Do not use any products that contain nicotine or tobacco. These products include cigarettes, chewing tobacco, and vaping devices, such as e-cigarettes. If you need help quitting, ask your health care provider. ?Do not use drugs. ?If you are sexually active, practice safe sex. Use a condom or other form of protection in order to prevent STIs. ?Take  aspirin only as told by your health care provider. Make sure that you understand how much to take and what form to take. Work with your health care provider to find out whether it is safe and beneficial

## 2021-07-12 NOTE — Progress Notes (Signed)
? ?ANNUAL PREVENTATIVE CARE GYNECOLOGY  ENCOUNTER NOTE ? ?Subjective:  ?    ? Cassandra Gill is a 81 y.o. G31P2002 female here for a routine annual gynecologic exam. The patient is not sexually active. The patient is not taking hormone replacement therapy. Patient denies post-menopausal vaginal bleeding. The patient wears seatbelts: yes. The patient participates in regular exercise: no. Has the patient ever been transfused or tattooed?: no. The patient reports that there is not domestic violence in her life. ? ?Current complaints: ?1.  None  ?  ?Gynecologic History ?No LMP recorded. Patient is postmenopausal. ?Contraception: post menopausal status ?Last Pap: 2019, per patient. Results were: normal ?Last mammogram: 04/04/2018. Results were: normal ?Last Colonoscopy: 2013.  Results were normal.  ?Last Dexa Scan:  reports it is up to date but cannot recall year performed.  ?  ? ? ?Obstetric History ?OB History  ?Gravida Para Term Preterm AB Living  ?'2 2 2     2  '$ ?SAB IAB Ectopic Multiple Live Births  ?        2  ?  ?# Outcome Date GA Lbr Len/2nd Weight Sex Delivery Anes PTL Lv  ?2 Term 80    F Vag-Spont   LIV  ?[place] ? ?Past Medical History:  ?Diagnosis Date  ? Blind   ? High cholesterol   ? ? ?Family History  ?Problem Relation Age of Onset  ? Breast cancer Maternal Aunt 54  ? Breast cancer Cousin   ?     maternal side <50  ? ? ?Past Surgical History:  ?Procedure Laterality Date  ? TOTAL HIP ARTHROPLASTY    ? ? ?Social History  ? ?Socioeconomic History  ? Marital status: Married  ?  Spouse name: Not on file  ? Number of children: Not on file  ? Years of education: Not on file  ? Highest education level: Not on file  ?Occupational History  ? Not on file  ?Tobacco Use  ? Smoking status: Never  ? Smokeless tobacco: Never  ?Vaping Use  ? Vaping Use: Never used  ?Substance and Sexual Activity  ? Alcohol use: Yes  ?  Comment: socially/occass  ? Drug use: Never  ? Sexual activity: Not Currently   ?Other Topics Concern  ? Not on file  ?Social History Narrative  ? Not on file  ? ?Social Determinants of Health  ? ?Financial Resource Strain: Not on file  ?Food Insecurity: Not on file  ?Transportation Needs: Not on file  ?Physical Activity: Not on file  ?Stress: Not on file  ?Social Connections: Not on file  ?Intimate Partner Violence: Not on file  ? ? ?Current Outpatient Medications on File Prior to Visit  ?Medication Sig Dispense Refill  ? simvastatin (ZOCOR) 20 MG tablet Take 20 mg by mouth daily.    ? ?No current facility-administered medications on file prior to visit.  ? ? ?No Known Allergies ? ? ? ?Review of Systems ?ROS ?Review of Systems - General ROS: negative for - chills, fatigue, fever, hot flashes, night sweats, weight gain or weight loss ?Psychological ROS: negative for - anxiety, decreased libido, depression, mood swings, physical abuse or sexual abuse ?Ophthalmic ROS: negative for - blurry vision, eye pain or loss of vision ?ENT ROS: negative for - headaches, hearing change, visual changes or vocal changes ?Allergy and Immunology ROS: negative for - hives, itchy/watery eyes or seasonal allergies ?Hematological and Lymphatic ROS: negative for - bleeding  problems, bruising, swollen lymph nodes or weight loss ?Endocrine ROS: negative for - galactorrhea, hair pattern changes, hot flashes, malaise/lethargy, mood swings, palpitations, polydipsia/polyuria, skin changes, temperature intolerance or unexpected weight changes ?Breast ROS: negative for - new or changing breast lumps or nipple discharge ?Respiratory ROS: negative for - cough or shortness of breath ?Cardiovascular ROS: negative for - chest pain, irregular heartbeat, palpitations or shortness of breath ?Gastrointestinal ROS: no abdominal pain, change in bowel habits, or black or bloody stools ?Genito-Urinary ROS: no dysuria, trouble voiding, or hematuria ?Musculoskeletal ROS: negative for - joint pain or joint stiffness ?Neurological ROS:  negative for - bowel and bladder control changes ?Dermatological ROS: negative for rash and skin lesion changes ?  ?Objective:  ? ?BP (!) 127/59   Pulse (!) 59   Resp 16   Ht '5\' 1"'$  (1.549 m)   Wt 186 lb (84.4 kg)   BMI 35.14 kg/m?  ?CONSTITUTIONAL: Well-developed, well-nourished female in no acute distress.  ?PSYCHIATRIC: Normal mood and affect. Normal behavior. Normal judgment and thought content. ?[place]: Alert and oriented to person, place, and time. Normal muscle tone coordination. No cranial nerve deficit noted. ?HENT:  Normocephalic, atraumatic, External right and left ear normal. Oropharynx is clear and moist ?EYES: Conjunctivae and EOM are normal. Pupils are equal, round, and reactive to light. No scleral icterus.  ?NECK: Normal range of motion, supple, no masses.  Normal thyroid.  ?SKIN: Skin is warm and dry. No rash noted. Not diaphoretic. No erythema. No pallor. Fleshy raised mass, likely subcutaneous on left lateral knee (likely lipoma, patient notes it has been there for ~ 15 years, no change) ?CARDIOVASCULAR: Normal heart rate noted, regular rhythm, no murmur. ?RESPIRATORY: Clear to auscultation bilaterally. Effort and breath sounds normal, no problems with respiration noted. ?BREASTS: Symmetric in size. No masses, skin changes, nipple drainage, or lymphadenopathy. ?ABDOMEN: Soft, normal bowel sounds, no distention noted.  No tenderness, rebound or guarding.  ?BLADDER: Normal ?PELVIC: Deferred  ?MUSCULOSKELETAL: Normal range of motion. No tenderness.  No cyanosis, clubbing, or edema.  2+ distal pulses. ?LYMPHATIC: No Axillary, Supraclavicular, or Inguinal Adenopathy. ? ? ?Labs: ?Labs performed by PCP ? ?Assessment:  ? ?1. Encounter for routine gynecologic examination in Medicare patient   ?2. Lipoadenoma   ?3. Stress incontinence, female   ? ? ?Plan:  ?Pap: Not needed ?Mammogram:  Up to date ?Stool Guaiac Testing:  Not Ordered ?Labs:  Barthol[place] Bill labs done by PCP ?Routine preventative health  maintenance measures emphasized: Exercise/Diet/Weight control, Tobacco Warnings, Alcohol/Substance use risks, Stress Management ?Stress incontinence, helped with Oxybutynin (although typically prescribed for urge) ?Lipoma asymptomatic, no change.  ?Advised on no further GYN screenings needed, has mammograms ordered by PCP. Up to date on all other screens. Can see GYN as needed.  ? ? ?Rubie Maid, MD ?[facility]  ? ? ? ? ? ? ? ? ? ? ? ?  ?

## 2021-07-13 ENCOUNTER — Ambulatory Visit (INDEPENDENT_AMBULATORY_CARE_PROVIDER_SITE_OTHER): Payer: Medicare Other | Admitting: Obstetrics and Gynecology

## 2021-07-13 ENCOUNTER — Encounter: Payer: Self-pay | Admitting: Obstetrics and Gynecology

## 2021-07-13 VITALS — BP 127/59 | HR 59 | Resp 16 | Ht 61.0 in | Wt 186.0 lb

## 2021-07-13 DIAGNOSIS — Z01419 Encounter for gynecological examination (general) (routine) without abnormal findings: Secondary | ICD-10-CM | POA: Diagnosis not present

## 2021-07-13 DIAGNOSIS — D369 Benign neoplasm, unspecified site: Secondary | ICD-10-CM | POA: Diagnosis not present

## 2021-07-13 DIAGNOSIS — N393 Stress incontinence (female) (male): Secondary | ICD-10-CM

## 2021-12-16 ENCOUNTER — Other Ambulatory Visit: Payer: Self-pay | Admitting: Unknown Physician Specialty

## 2021-12-16 DIAGNOSIS — E041 Nontoxic single thyroid nodule: Secondary | ICD-10-CM

## 2022-01-04 ENCOUNTER — Ambulatory Visit
Admission: RE | Admit: 2022-01-04 | Discharge: 2022-01-04 | Disposition: A | Discharge status: Home / Self Care | Payer: Medicare Other | Source: Ambulatory Visit | Attending: Unknown Physician Specialty | Admitting: Unknown Physician Specialty

## 2022-01-04 DIAGNOSIS — E041 Nontoxic single thyroid nodule: Secondary | ICD-10-CM

## 2022-01-06 ENCOUNTER — Other Ambulatory Visit: Payer: Self-pay | Admitting: Unknown Physician Specialty

## 2022-01-06 DIAGNOSIS — E041 Nontoxic single thyroid nodule: Secondary | ICD-10-CM

## 2022-01-12 ENCOUNTER — Ambulatory Visit
Admission: RE | Admit: 2022-01-12 | Discharge: 2022-01-12 | Disposition: A | Discharge status: Home / Self Care | Payer: Medicare Other | Source: Ambulatory Visit | Attending: Unknown Physician Specialty | Admitting: Unknown Physician Specialty

## 2022-01-12 DIAGNOSIS — E041 Nontoxic single thyroid nodule: Secondary | ICD-10-CM | POA: Diagnosis present

## 2022-01-12 MED ORDER — LIDOCAINE HCL (PF) 1 % IJ SOLN
10.0000 mL | Freq: Once | INTRAMUSCULAR | Status: AC
  Administered 2022-01-12: 10 mL via INTRADERMAL
  Filled 2022-01-12: qty 10

## 2022-01-12 NOTE — Discharge Instructions (Signed)
Thyroid Biopsy/Fine Needle Biopsy  Care After The following information offers guidance on how to care for yourself after your procedure. Your health care provider may also give you more specific instructions. If you have problems or questions, contact your health care provider. What can I expect after the procedure? After the procedure, it is common to have: Soreness, pain, and tenderness where your aspiration was performed.  Bruising or mild pain at the aspiration site.  These symptoms should go away after a few days. Follow these instructions at home: Biopsy site care  Follow instructions from your health care provider about how to take care of your aspiration site. Make sure you: Wash your hands with soap and water for at least 20 seconds before and after you change your bandage (dressing). If soap and water are not available, use hand sanitizer. Remove dressing tomorrow Check your puncture site every day for signs of infection. Check for: More redness, swelling, or pain. More drainage of fluid or blood. More warmth. Pus or a bad smell. General instructions Rest as told by your health care provider. Do not take baths, swim, or use a hot tub for 1 week.  You may shower tomorrow. Take over-the-counter and prescription medicines only as told by your health care provider. Return to your normal activities tomorrow.  If you have airplane travel scheduled, talk with your health care provider about when it is safe for you to travel by airplane. It is up to you to get the results of your procedure. Ask your health care provider, or the department that is doing the procedure, when your results will be ready. Keep all follow-up visits.   Contact a health care provider if: You have a fever. You have more redness, swelling, or pain at the puncture site that lasts longer than a few days. You have more fluid or blood coming from your puncture site. You have pus or a bad smell coming from your  puncture site. Your puncture site feels warm to the touch. You have pain that does not get better with medicine. Get help right away if: You have severe bleeding from the puncture site. You have chest pain. You have problems breathing. You cough up blood. You faint. You have a very fast heart rate. These symptoms may be an emergency. Get help right away. Call 911. Do not wait to see if the symptoms will go away. Do not drive yourself to the hospital. Summary After the procedure, it is common to have soreness, bruising, tenderness, or mild pain at the aspiration site.  These symptoms should go away in a few days. Check your aspiration site every day for signs of infection, such as more redness, swelling, or pain. Do not take baths, swim, or use a hot tub for one week. Ask your health care provider if you may take showers. Contact a heath care provider if you have more redness, swelling, or pain at the puncture site that lasts longer than a few days. This information is not intended to replace advice given to you by your health care provider. Make sure you discuss any questions you have with your health care provider. Document Revised: 03/03/2021 Document Reviewed: 03/03/2021 Elsevier Patient Education  2023 [employer] 

## 2022-01-12 NOTE — Procedures (Signed)
Successful US guided FNA of right mid thyroid nodule No complications. Successful US guided FNA of left inferior thyroid nodule No complications. See PACS for full report.    Narda Rutherford, AGNP-BC 01/12/2022, 2:14 PM

## 2022-01-13 LAB — CYTOLOGY - NON PAP

## 2022-02-02 ENCOUNTER — Encounter: Payer: Self-pay | Admitting: Unknown Physician Specialty

## 2022-02-02 LAB — CYTOLOGY - NON PAP

## 2022-02-04 ENCOUNTER — Other Ambulatory Visit: Payer: Self-pay | Admitting: Unknown Physician Specialty

## 2022-02-04 DIAGNOSIS — E041 Nontoxic single thyroid nodule: Secondary | ICD-10-CM

## 2022-05-26 ENCOUNTER — Other Ambulatory Visit: Payer: Self-pay | Admitting: Internal Medicine

## 2022-05-26 DIAGNOSIS — Z1231 Encounter for screening mammogram for malignant neoplasm of breast: Secondary | ICD-10-CM

## 2022-05-31 ENCOUNTER — Ambulatory Visit
Admission: RE | Admit: 2022-05-31 | Discharge: 2022-05-31 | Disposition: A | Discharge status: Home / Self Care | Payer: Medicare Other | Source: Ambulatory Visit | Attending: Internal Medicine | Admitting: Internal Medicine

## 2022-05-31 DIAGNOSIS — Z1231 Encounter for screening mammogram for malignant neoplasm of breast: Secondary | ICD-10-CM | POA: Diagnosis not present

## 2022-07-27 ENCOUNTER — Ambulatory Visit
Admission: RE | Admit: 2022-07-27 | Discharge: 2022-07-27 | Disposition: A | Discharge status: Home / Self Care | Payer: Medicare Other | Source: Ambulatory Visit | Attending: Unknown Physician Specialty | Admitting: Unknown Physician Specialty

## 2022-07-27 DIAGNOSIS — E041 Nontoxic single thyroid nodule: Secondary | ICD-10-CM

## 2022-08-02 ENCOUNTER — Other Ambulatory Visit: Payer: Self-pay | Admitting: Unknown Physician Specialty

## 2022-08-02 DIAGNOSIS — E041 Nontoxic single thyroid nodule: Secondary | ICD-10-CM

## 2023-05-23 ENCOUNTER — Other Ambulatory Visit: Payer: Self-pay | Admitting: Internal Medicine

## 2023-05-23 DIAGNOSIS — Z1231 Encounter for screening mammogram for malignant neoplasm of breast: Secondary | ICD-10-CM

## 2023-05-25 ENCOUNTER — Other Ambulatory Visit: Payer: Self-pay | Admitting: Internal Medicine

## 2023-05-25 DIAGNOSIS — R0989 Other specified symptoms and signs involving the circulatory and respiratory systems: Secondary | ICD-10-CM

## 2023-07-26 ENCOUNTER — Ambulatory Visit

## 2023-07-26 ENCOUNTER — Ambulatory Visit
Admission: RE | Admit: 2023-07-26 | Discharge: 2023-07-26 | Disposition: A | Discharge status: Home / Self Care | Source: Ambulatory Visit | Attending: Internal Medicine | Admitting: Internal Medicine

## 2023-07-26 DIAGNOSIS — Z1231 Encounter for screening mammogram for malignant neoplasm of breast: Secondary | ICD-10-CM | POA: Insufficient documentation

## 2023-07-26 DIAGNOSIS — R0989 Other specified symptoms and signs involving the circulatory and respiratory systems: Secondary | ICD-10-CM | POA: Insufficient documentation

## 2023-09-13 IMAGING — MG MM DIGITAL SCREENING BILAT W/ TOMO AND CAD
6 of 12 series · 6 of 36 positions shown · non-contrast
Comparison: Previous exam(s).

CLINICAL DATA: Screening.

EXAM:
DIGITAL SCREENING BILATERAL MAMMOGRAM WITH TOMOSYNTHESIS AND CAD
TECHNIQUE: Bilateral screening digital craniocaudal and mediolateral oblique
mammograms were obtained. Bilateral screening digital breast
tomosynthesis was performed. The images were evaluated with
computer-aided detection.

[L MLO synth-2D (1 of 2)]
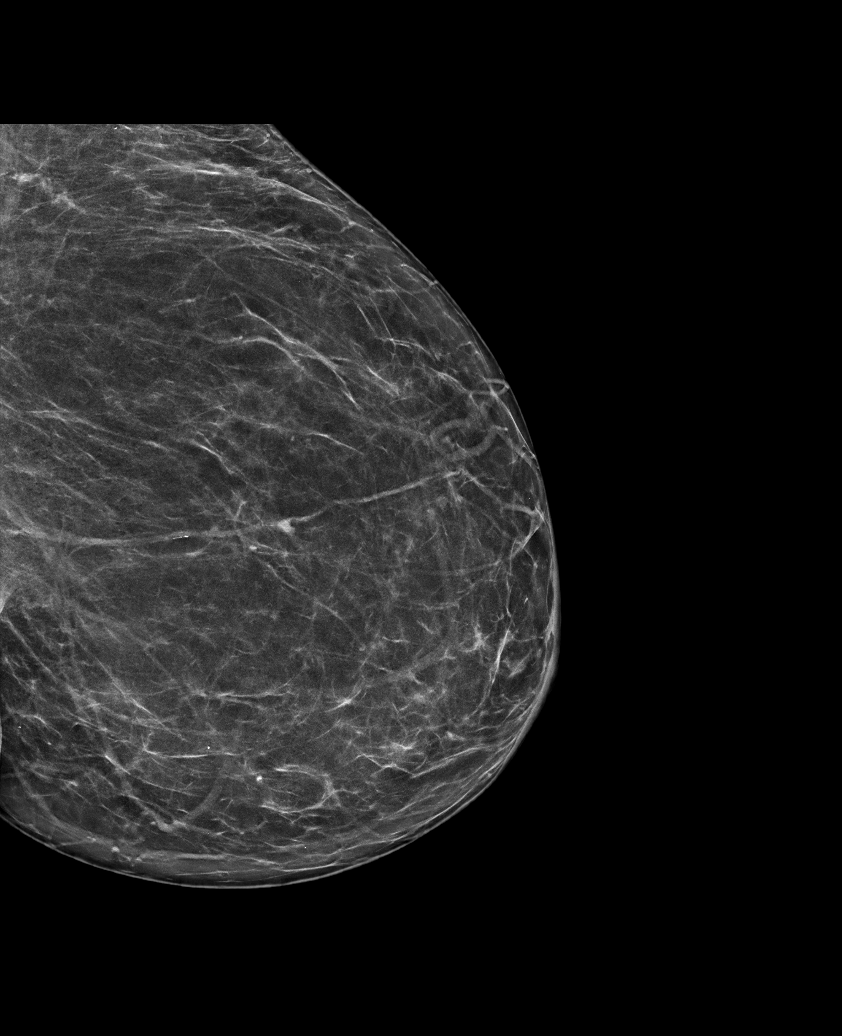

[R MLO synth-2D (1 of 2)]
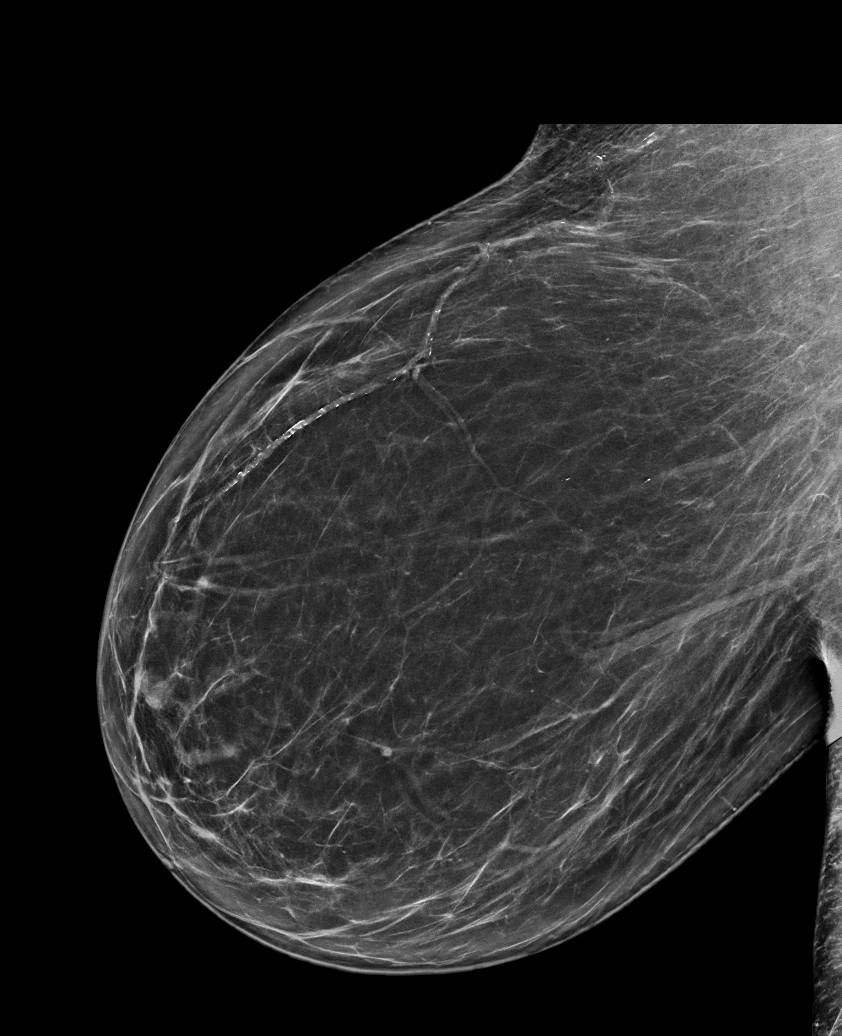

[L CC synth-2D]
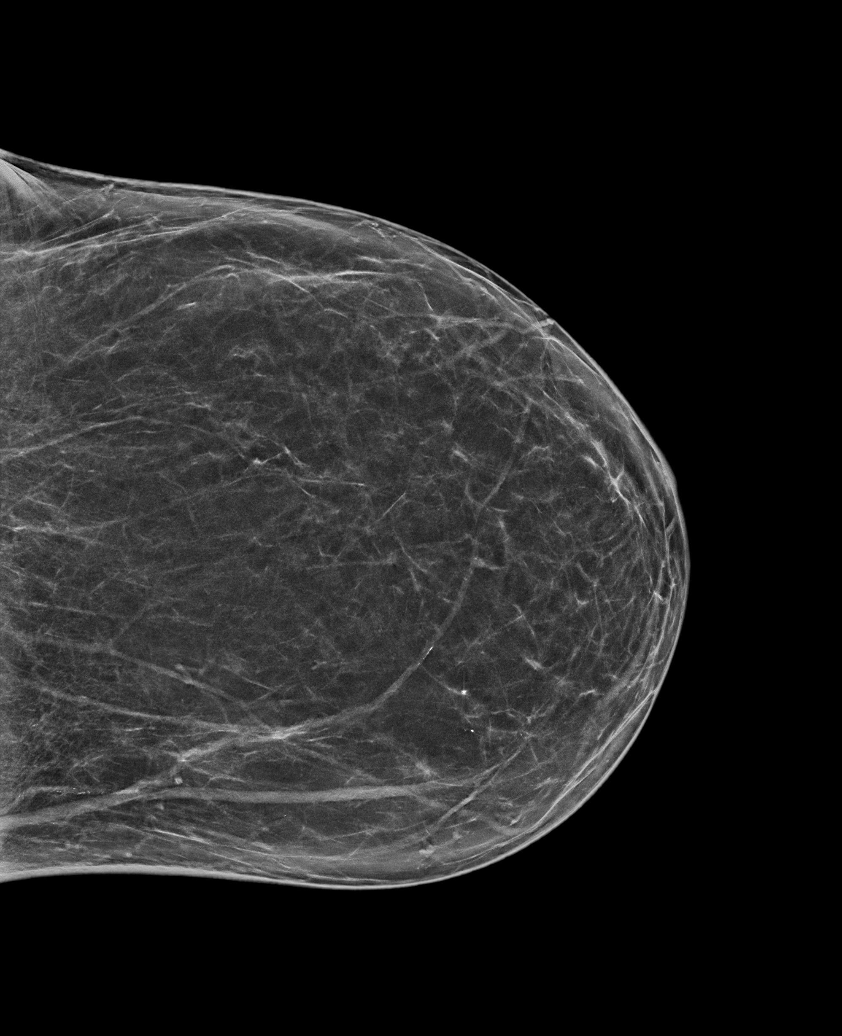

[R CC synth-2D]
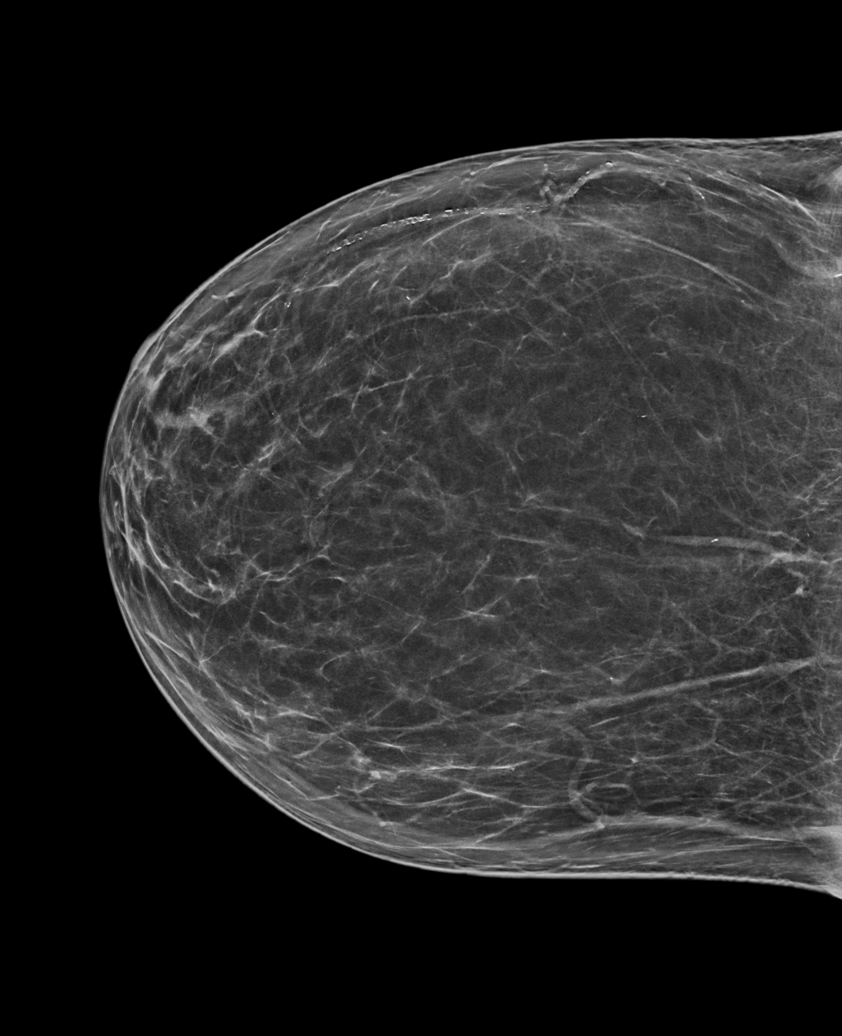

[R MLO synth-2D (2 of 2)]
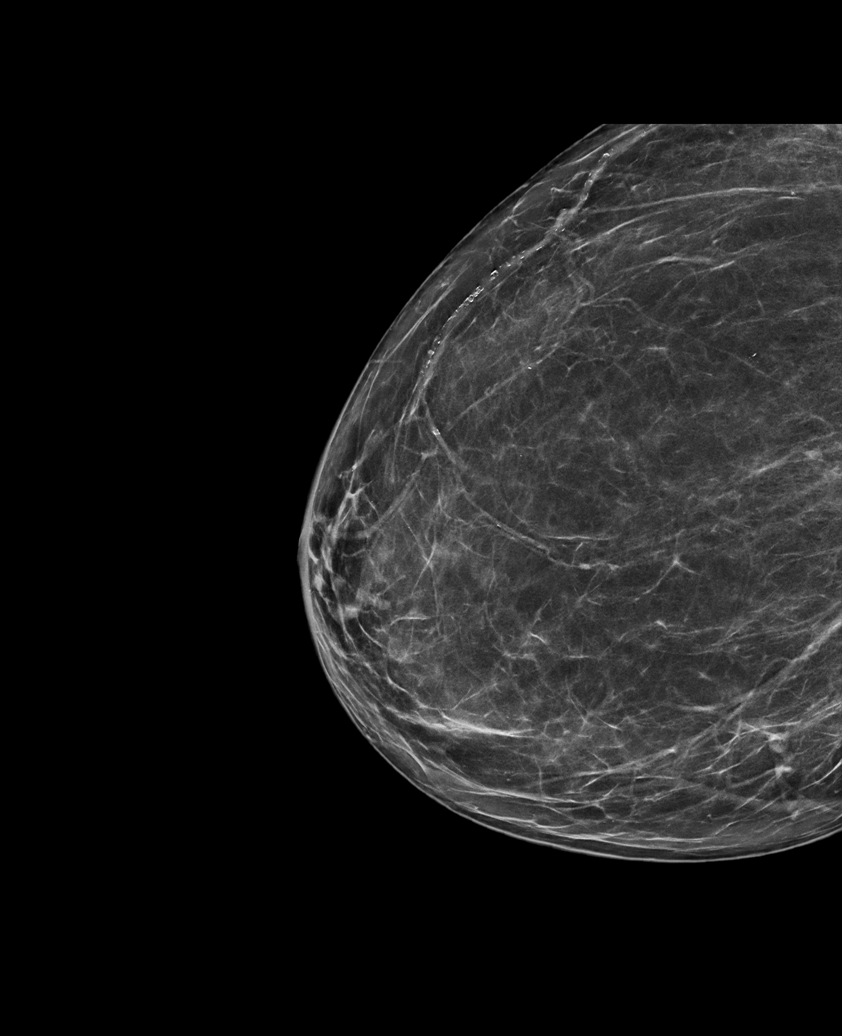

[L MLO synth-2D (2 of 2)]
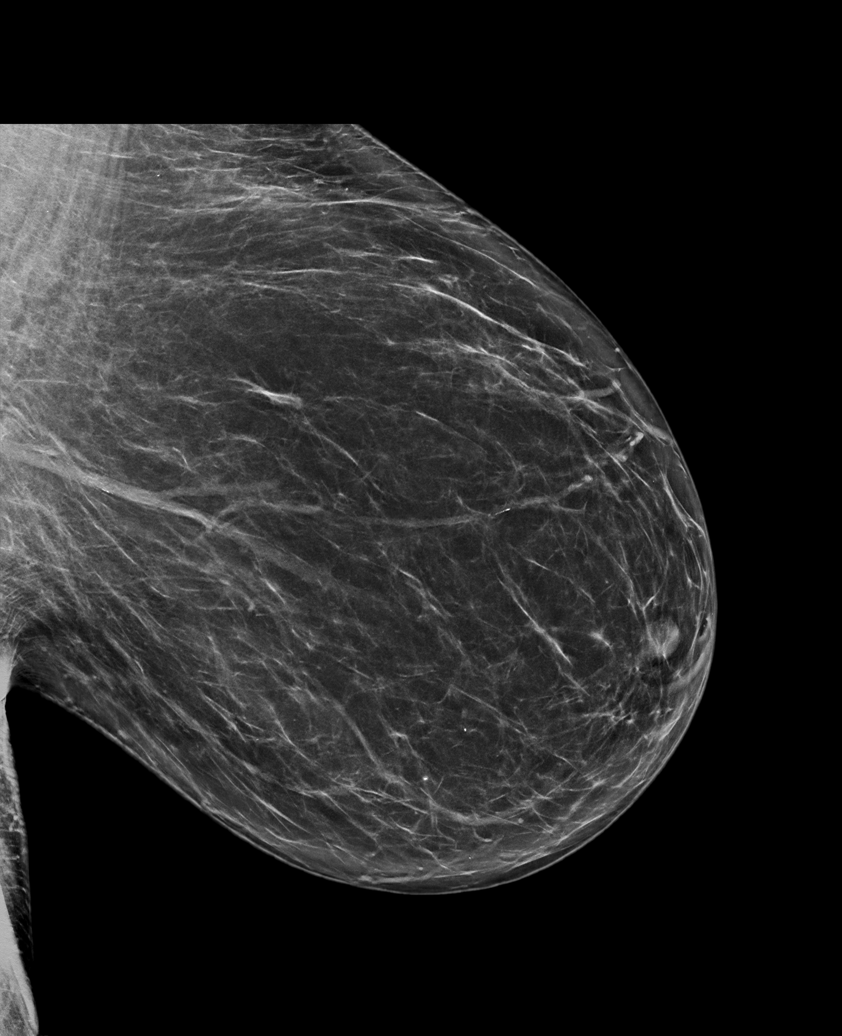

[6 of 36 positions shown; findings below may reference images not displayed]

ACR Breast Density Category b: There are scattered areas of
fibroglandular density.
FINDINGS: There are no findings suspicious for malignancy.
IMPRESSION: No mammographic evidence of malignancy. A result letter of this
screening mammogram will be mailed directly to the patient.

RECOMMENDATION:
Screening mammogram in one year. (Code:51-O-LD2)

BI-RADS CATEGORY  1: Negative.

## 2023-10-26 ENCOUNTER — Other Ambulatory Visit: Payer: Self-pay | Admitting: Unknown Physician Specialty

## 2023-10-26 DIAGNOSIS — E041 Nontoxic single thyroid nodule: Secondary | ICD-10-CM

## 2023-11-15 ENCOUNTER — Encounter: Payer: Self-pay | Admitting: Internal Medicine

## 2023-11-15 ENCOUNTER — Other Ambulatory Visit: Payer: Self-pay

## 2023-11-15 ENCOUNTER — Emergency Department

## 2023-11-15 ENCOUNTER — Inpatient Hospital Stay
Admission: EM | Admit: 2023-11-15 | Discharge: 2023-11-21 | DRG: 481 | Disposition: A | Discharge status: Skilled Nursing Facility | Attending: Internal Medicine | Admitting: Internal Medicine

## 2023-11-15 DIAGNOSIS — Z7901 Long term (current) use of anticoagulants: Secondary | ICD-10-CM | POA: Diagnosis not present

## 2023-11-15 DIAGNOSIS — H548 Legal blindness, as defined in USA: Secondary | ICD-10-CM | POA: Diagnosis present

## 2023-11-15 DIAGNOSIS — Z79899 Other long term (current) drug therapy: Secondary | ICD-10-CM | POA: Diagnosis not present

## 2023-11-15 DIAGNOSIS — W010XXA Fall on same level from slipping, tripping and stumbling without subsequent striking against object, initial encounter: Secondary | ICD-10-CM | POA: Diagnosis present

## 2023-11-15 DIAGNOSIS — R7303 Prediabetes: Secondary | ICD-10-CM | POA: Diagnosis present

## 2023-11-15 DIAGNOSIS — I82452 Acute embolism and thrombosis of left peroneal vein: Secondary | ICD-10-CM | POA: Diagnosis not present

## 2023-11-15 DIAGNOSIS — E7800 Pure hypercholesterolemia, unspecified: Secondary | ICD-10-CM | POA: Diagnosis present

## 2023-11-15 DIAGNOSIS — H547 Unspecified visual loss: Secondary | ICD-10-CM

## 2023-11-15 DIAGNOSIS — J9811 Atelectasis: Secondary | ICD-10-CM | POA: Diagnosis present

## 2023-11-15 DIAGNOSIS — Z6833 Body mass index (BMI) 33.0-33.9, adult: Secondary | ICD-10-CM | POA: Diagnosis not present

## 2023-11-15 DIAGNOSIS — I10 Essential (primary) hypertension: Secondary | ICD-10-CM | POA: Diagnosis present

## 2023-11-15 DIAGNOSIS — I82409 Acute embolism and thrombosis of unspecified deep veins of unspecified lower extremity: Secondary | ICD-10-CM | POA: Diagnosis not present

## 2023-11-15 DIAGNOSIS — D696 Thrombocytopenia, unspecified: Secondary | ICD-10-CM | POA: Diagnosis present

## 2023-11-15 DIAGNOSIS — E785 Hyperlipidemia, unspecified: Secondary | ICD-10-CM | POA: Diagnosis not present

## 2023-11-15 DIAGNOSIS — Y92009 Unspecified place in unspecified non-institutional (private) residence as the place of occurrence of the external cause: Secondary | ICD-10-CM | POA: Diagnosis not present

## 2023-11-15 DIAGNOSIS — Z602 Problems related to living alone: Secondary | ICD-10-CM | POA: Diagnosis present

## 2023-11-15 DIAGNOSIS — S72002A Fracture of unspecified part of neck of left femur, initial encounter for closed fracture: Secondary | ICD-10-CM | POA: Diagnosis present

## 2023-11-15 DIAGNOSIS — W19XXXA Unspecified fall, initial encounter: Secondary | ICD-10-CM

## 2023-11-15 DIAGNOSIS — S72142A Displaced intertrochanteric fracture of left femur, initial encounter for closed fracture: Principal | ICD-10-CM | POA: Diagnosis present

## 2023-11-15 DIAGNOSIS — E669 Obesity, unspecified: Secondary | ICD-10-CM | POA: Diagnosis present

## 2023-11-15 DIAGNOSIS — E119 Type 2 diabetes mellitus without complications: Secondary | ICD-10-CM

## 2023-11-15 DIAGNOSIS — E66811 Obesity, class 1: Secondary | ICD-10-CM | POA: Diagnosis present

## 2023-11-15 DIAGNOSIS — N3281 Overactive bladder: Secondary | ICD-10-CM | POA: Diagnosis present

## 2023-11-15 DIAGNOSIS — M810 Age-related osteoporosis without current pathological fracture: Secondary | ICD-10-CM | POA: Diagnosis present

## 2023-11-15 DIAGNOSIS — Z803 Family history of malignant neoplasm of breast: Secondary | ICD-10-CM | POA: Diagnosis not present

## 2023-11-15 DIAGNOSIS — E042 Nontoxic multinodular goiter: Secondary | ICD-10-CM | POA: Diagnosis present

## 2023-11-15 HISTORY — DX: Essential (primary) hypertension: I10

## 2023-11-15 LAB — CBC WITH DIFFERENTIAL/PLATELET
Abs Immature Granulocytes: 0.06 K/uL (ref 0.00–0.07)
Basophils Absolute: 0.1 K/uL (ref 0.0–0.1)
Basophils Relative: 1 %
Eosinophils Absolute: 0.1 K/uL (ref 0.0–0.5)
Eosinophils Relative: 1 %
HCT: 43.8 % (ref 36.0–46.0)
Hemoglobin: 13.8 g/dL (ref 12.0–15.0)
Immature Granulocytes: 1 %
Lymphocytes Relative: 21 %
Lymphs Abs: 2.2 K/uL (ref 0.7–4.0)
MCH: 29.1 pg (ref 26.0–34.0)
MCHC: 31.5 g/dL (ref 30.0–36.0)
MCV: 92.4 fL (ref 80.0–100.0)
Monocytes Absolute: 0.6 K/uL (ref 0.1–1.0)
Monocytes Relative: 6 %
Neutro Abs: 7.4 K/uL (ref 1.7–7.7)
Neutrophils Relative %: 70 %
Platelets: 171 K/uL (ref 150–400)
RBC: 4.74 MIL/uL (ref 3.87–5.11)
RDW: 13 % (ref 11.5–15.5)
WBC: 10.5 K/uL (ref 4.0–10.5)
nRBC: 0 % (ref 0.0–0.2)

## 2023-11-15 LAB — APTT: aPTT: 36 s (ref 24–36)

## 2023-11-15 LAB — BASIC METABOLIC PANEL WITH GFR
Anion gap: 10 (ref 5–15)
BUN: 24 mg/dL — ABNORMAL HIGH (ref 8–23)
CO2: 22 mmol/L (ref 22–32)
Calcium: 9.6 mg/dL (ref 8.9–10.3)
Chloride: 106 mmol/L (ref 98–111)
Creatinine, Ser: 0.64 mg/dL (ref 0.44–1.00)
GFR, Estimated: >60 mL/min (ref >60)
Glucose, Bld: 172 mg/dL — ABNORMAL HIGH (ref 70–99)
Potassium: 4.1 mmol/L (ref 3.5–5.1)
Sodium: 138 mmol/L (ref 135–145)

## 2023-11-15 LAB — TYPE AND SCREEN
ABO/RH(D): O POS
Antibody Screen: NEGATIVE

## 2023-11-15 LAB — PROTIME-INR
INR: 1 (ref 0.8–1.2)
Prothrombin Time: 14 s (ref 11.4–15.2)

## 2023-11-15 MED ORDER — CEFAZOLIN SODIUM-DEXTROSE 2-4 GM/100ML-% IV SOLN
2.0000 g | INTRAVENOUS | Status: AC
  Administered 2023-11-16: 2 g via INTRAVENOUS

## 2023-11-15 MED ORDER — ACETAMINOPHEN 325 MG PO TABS
650.0000 mg | ORAL_TABLET | Freq: Four times a day (QID) | ORAL | Status: DC | PRN
  Administered 2023-11-17 – 2023-11-19 (×5): 650 mg via ORAL
  Filled 2023-11-15 (×5): qty 2

## 2023-11-15 MED ORDER — ONDANSETRON HCL 4 MG/2ML IJ SOLN: 4.0000 mg | Freq: Three times a day (TID) | INTRAMUSCULAR | Status: DC | PRN

## 2023-11-15 MED ORDER — SENNOSIDES-DOCUSATE SODIUM 8.6-50 MG PO TABS: 1.0000 | ORAL_TABLET | Freq: Every evening | ORAL | Status: DC | PRN

## 2023-11-15 MED ORDER — METHOCARBAMOL 500 MG PO TABS
500.0000 mg | ORAL_TABLET | Freq: Three times a day (TID) | ORAL | Status: DC | PRN
  Administered 2023-11-18 – 2023-11-20 (×4): 500 mg via ORAL
  Filled 2023-11-15 (×4): qty 1

## 2023-11-15 MED ORDER — HYDRALAZINE HCL 20 MG/ML IJ SOLN: 5.0000 mg | INTRAMUSCULAR | Status: DC | PRN

## 2023-11-15 MED ORDER — SODIUM CHLORIDE 0.9 % IV BOLUS
500.0000 mL | Freq: Once | INTRAVENOUS | Status: AC
  Administered 2023-11-15: 500 mL via INTRAVENOUS

## 2023-11-15 MED ORDER — OXYCODONE-ACETAMINOPHEN 5-325 MG PO TABS: 1.0000 | ORAL_TABLET | ORAL | Status: DC | PRN

## 2023-11-15 MED ORDER — MORPHINE SULFATE (PF) 2 MG/ML IV SOLN
1.0000 mg | INTRAVENOUS | Status: DC | PRN
  Administered 2023-11-15 – 2023-11-16 (×2): 1 mg via INTRAVENOUS
  Filled 2023-11-15 (×2): qty 1

## 2023-11-15 MED ORDER — LIDOCAINE 5 % EX PTCH
1.0000 | MEDICATED_PATCH | CUTANEOUS | Status: DC
  Administered 2023-11-15 – 2023-11-16 (×2): 1 via TRANSDERMAL
  Filled 2023-11-15 (×2): qty 1

## 2023-11-15 NOTE — ED Triage Notes (Signed)
 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 States she had a mechanical slip and fall. Noted rotation and shortening to L hip.

## 2023-11-15 NOTE — H&P (Signed)
 History and Physical    Cassandra Gill FMW:[id number] DOB: Jul 30, 1940 DOA: 11/15/2023  Referring MD/NP/PA:   PCP: Diedra Lame, MD   Patient coming from:  The patient is coming from home.     Chief Complaint: fall and left hip pain  HPI: Cassandra Gill is a 83 y.o. female with medical history significant of HTN, HLD, stress incontinence, blindness, who presents with fall and left hip pain.  Patient is a deeply blind, lives alone at home.  She states that she was waling and turning and lost her balance and fell.  She developed left hip pain, which is constant, sharp, moderate to severe, nonradiating, aggravated by movement.  She is unable to get up and bear weight on the left leg.  No chest pain, cough, SOB.  No nausea, vomiting, diarrhea or abdominal pain.  No symptoms of UTI.  Denies alcohol drinking.  Patient strongly denies any head or neck injury, she refused CT scan of head and neck.  Data reviewed independently and ED Course: pt was found to have WBC 10.5, GFR> 60.  Temperature normal, blood pressure 151/64, heart rate 66, oxygen saturation 98% on room air.  X-ray of left hip/pelvis showed acute comminuted inter trochanteric fractures of the proximal left femur with varus angulation.  Patient is admitted to MedSurg bed as inpatient.  CXR: 1. Right perihilar and suprahilar opacity may represent consolidation or pneumonia versus lymphadenopathy or mass lesion. CT chest suggested for further evaluation. 2. Left lung is clear.     EKG:  Not done in ED, will get one.    Review of Systems:   General: no fevers, chills, no body weight gain,  has fatigue HEENT: no hearing changes or sore throat. Has blindness Respiratory: no dyspnea, coughing, wheezing CV: no chest pain, no palpitations GI: no nausea, vomiting, abdominal pain, diarrhea, constipation GU: no dysuria, burning on urination, increased urinary frequency, hematuria  Ext: no leg edema Neuro: no unilateral weakness,  numbness, or tingling, no vision change or hearing loss. Has fall Skin: no rash, no skin tear. MSK: Has left hip pain Heme: No easy bruising.  Travel history: No recent long distant travel.   Allergy: No Known Allergies  Past Medical History:  Diagnosis Date   Blind    High cholesterol    HTN (hypertension)     Past Surgical History:  Procedure Laterality Date   TOTAL HIP ARTHROPLASTY      Social History:  reports that she has never smoked. She has never used smokeless tobacco. She reports current alcohol use. She reports that she does not use drugs.  Family History:  Family History  Problem Relation Age of Onset   Breast cancer Maternal Aunt 30   Breast cancer Cousin        maternal side <50     Prior to Admission medications   Medication Sig Start Date End Date Taking? Authorizing Provider  furosemide  (LASIX ) 20 MG tablet furosemide  20 mg tablet    [provider]  oxybutynin  (DITROPAN ) 5 MG tablet oxybutynin  chloride 5 mg tablet    [provider]  simvastatin  (ZOCOR ) 20 MG tablet Take 20 mg by mouth daily.    [provider]    Physical Exam: Vitals:   11/15/23 2129 11/15/23 2300 11/16/23 0313  BP: (!) 151/64 (!) 158/64 (!) 136/57  Pulse: 66 72 77  Resp: 18 16 15   Temp: 98 F (36.7 C) 97.8 F (36.6 C) 99.3 F (37.4 C)  TempSrc: Oral Oral Oral  SpO2: 98% 100% 99%  Weight:  77.5 kg   Height:  5' (1.524 m)    General: Not in acute distress HEENT:       Eyes: PERRL, EOMI, no jaundice       ENT: No discharge from the ears and nose, no pharynx injection, no tonsillar enlargement.        Neck: No JVD, no bruit, no mass felt. Heme: No neck lymph node enlargement. Cardiac: S1/S2, RRR, No murmurs, No gallops or rubs. Respiratory: No rales, wheezing, rhonchi or rubs. GI: Soft, nondistended, nontender, no rebound pain, no organomegaly, BS present. GU: No hematuria Ext: No pitting leg edema bilaterally. 1+DP/PT pulse  bilaterally. Musculoskeletal: Has left hip tenderness. Skin: No rashes.  Neuro: Alert, oriented X3.  Patient has blindness. Psych: Patient is not psychotic, no suicidal or hemocidal ideation.  Labs on Admission: I have personally reviewed following labs and imaging studies  CBC: Recent Labs  Lab 11/15/23 2050  WBC 10.5  NEUTROABS 7.4  HGB 13.8  HCT 43.8  MCV 92.4  PLT 171   Basic Metabolic Panel: Recent Labs  Lab 11/15/23 2050  NA 138  K 4.1  CL 106  CO2 22  GLUCOSE 172*  BUN 24*  CREATININE 0.64  CALCIUM 9.6   GFR: Estimated Creatinine Clearance: 48.2 mL/min (by C-G formula based on SCr of 0.64 mg/dL). Liver Function Tests: No results for input(s): AST, ALT, ALKPHOS, BILITOT, PROT, ALBUMIN in the last 168 hours. No results for input(s): LIPASE, AMYLASE in the last 168 hours. No results for input(s): AMMONIA in the last 168 hours. Coagulation Profile: Recent Labs  Lab 11/15/23 2231  INR 1.0   Cardiac Enzymes: No results for input(s): CKTOTAL, CKMB, CKMBINDEX, TROPONINI in the last 168 hours. BNP (last 3 results) No results for input(s): PROBNP in the last 8760 hours. HbA1C: No results for input(s): HGBA1C in the last 72 hours. CBG: No results for input(s): GLUCAP in the last 168 hours. Lipid Profile: No results for input(s): CHOL, HDL, LDLCALC, TRIG, CHOLHDL, LDLDIRECT in the last 72 hours. Thyroid  Function Tests: No results for input(s): TSH, T4TOTAL, FREET4, T3FREE, THYROIDAB in the last 72 hours. Anemia Panel: No results for input(s): VITAMINB12, FOLATE, FERRITIN, TIBC, IRON, RETICCTPCT in the last 72 hours. Urine analysis: No results found for: COLORURINE, APPEARANCEUR, LABSPEC, PHURINE, GLUCOSEU, HGBUR, BILIRUBINUR, KETONESUR, PROTEINUR, UROBILINOGEN, NITRITE, LEUKOCYTESUR Sepsis Labs: @LABRCNTIP (procalcitonin:4,lacticidven:4) )No results found for this or any  previous visit (from the past 240 hours).   Radiological Exams on Admission:   Assessment/Plan Principal Problem:   Closed left hip fracture [facility]) Active Problems:   Fall at home, initial encounter   HTN (hypertension)   HLD (hyperlipidemia)   Blind   Assessment and Plan:  Closed left hip fracture [facility]): X-ray of left hip/pelvis showed acute comminuted inter trochanteric fractures of the proximal left femur with varus angulation. Patient has moderate pain now. No neurovascular compromise. Orthopedic surgeon, Dr. Edie was consulted.   - will admit to Med-surg bed as inpt - Pain control: prn morphine , percocet and tyleno - When necessary Zofran  for nausea - Robaxin  for muscle spasm - Lidoderm  patch for pain - type and cross - INR/PTT  Fall: -Fall precaution -PT/OT when able to (not ordered now)   HTN (hypertension: - IV hydralazine  as needed - Continue home Lasix   HLD (hyperlipidemia) -Zocor   Blind: - Fall precaution  Abnormal findings of chest x-ray:  CXR showed right perihilar and suprahilar opacity may represent consolidation or pneumonia versus lymphadenopathy  or mass lesion.  - Patient would like to CT scan of the chest after surgery since she has left hip pain. - I emphasized the importance of doing CT scan if she wants to do it as outpatient.  She verbally understood   Perioperative Cardiac Risk: pt has multiple comorbidities as listed above, but denies history of COPD, CAD, CHF.  Currently no any signs of CHF.  Patient denies chest pain, cough, SOB.  EKG is pending.  At this time point, no further work up is needed. Patient's GUPTA score perioperative myocardial infarction or cardaic arrest is < 1%.  I discussed the risk with patient and family, pt would like to proceed for surgery.    DVT ppx: SCD  Code Status: Full code   Family Communication:   Yes, patient's daughter and sister-in-law at bed side.   Disposition Plan:  Anticipate discharge back to  previous environment  Consults called: Dr. Edie of Ortho  Admission status and Level of care: Med-Surg:  s as inpt        Dispo: The patient is from: Home              Anticipated d/c is to: SNF              Anticipated d/c date is: 2 days              Patient currently is not medically stable to d/c.    Severity of Illness:  The appropriate patient status for this patient is INPATIENT. Inpatient status is judged to be reasonable and necessary in order to provide the required intensity of service to ensure the patient's safety. The patient's presenting symptoms, physical exam findings, and initial radiographic and laboratory data in the context of their chronic comorbidities is felt to place them at high risk for further clinical deterioration. Furthermore, it is not anticipated that the patient will be medically stable for discharge from the hospital within 2 midnights of admission.   * I certify that at the point of admission it is my clinical judgment that the patient will require inpatient hospital care spanning beyond 2 midnights from the point of admission due to high intensity of service, high risk for further deterioration and high frequency of surveillance required.*       Date of Service 11/16/2023    Caleb Exon [facility]   If 7PM-7AM, please contact night-coverage [email or web address] 11/16/2023, 4:49 AM

## 2023-11-15 NOTE — ED Provider Notes (Signed)
 [facility] Provider Note    Event Date/Time   First MD Initiated Contact with Patient 11/15/23 2048     (approximate)   History   Chief Complaint: Fall   HPI  Cassandra Gill is a 83 y.o. female with a history of blindness who comes ED after mechanical fall.  She reports that she was turning and lost her balance and fell.  Unable to get up and bear weight on the left leg after.  Has mild to moderate pain in the left hip.  Denies loss of consciousness, denies hitting her head.  No chest pain shortness of breath or other complaints.        Past Medical History:  Diagnosis Date   Blind    High cholesterol     Current Outpatient Rx   Order #: [id number] Class: Historical Med   Order #: [id number] Class: Historical Med   Order #: [id number] Class: Historical Med    Past Surgical History:  Procedure Laterality Date   TOTAL HIP ARTHROPLASTY      Physical Exam   Triage Vital Signs: ED Triage Vitals  Encounter Vitals Group     BP      Girls Systolic BP Percentile      Girls Diastolic BP Percentile      Boys Systolic BP Percentile      Boys Diastolic BP Percentile      Pulse      Resp      Temp      Temp src      SpO2      Weight      Height      Head Circumference      Peak Flow      Pain Score      Pain Loc      Pain Education      Exclude from Growth Chart     Most recent vital signs: Vitals:   11/15/23 2129  BP: (!) 151/64  Pulse: 66  Resp: 18  Temp: 98 F (36.7 C)  SpO2: 98%    General: Awake, no distress.  CV:  Good peripheral perfusion.  Regular rate rhythm Resp:  Normal effort.  Abd:  No distention.  Soft nontender Other:  Mild tenderness at the left greater trochanter.  There is external rotation and shortening of the left leg.   ED Results / Procedures / Treatments   Labs (all labs ordered are listed, but only abnormal results are displayed) Labs Reviewed  BASIC METABOLIC PANEL WITH GFR - Abnormal; Notable for  the following components:      Result Value   Glucose, Bld 172 (*)    BUN 24 (*)    All other components within normal limits  CBC WITH DIFFERENTIAL/PLATELET     EKG    RADIOLOGY X-ray left hip interpreted by me, shows intertrochanteric fracture of the left femur.  Radiology report reviewed   PROCEDURES:  Procedures   MEDICATIONS ORDERED IN ED: Medications  ceFAZolin  (ANCEF ) IVPB 2g/100 mL premix (has no administration in time range)  morphine  (PF) 2 MG/ML injection 1 mg (has no administration in time range)  oxyCODONE -acetaminophen  (PERCOCET/ROXICET) 5-325 MG per tablet 1 tablet (has no administration in time range)  methocarbamol  (ROBAXIN ) tablet 500 mg (has no administration in time range)  lidocaine  (LIDODERM ) 5 % 1 patch (has no administration in time range)  ondansetron  (ZOFRAN ) injection 4 mg (has no administration in time range)  hydrALAZINE  (APRESOLINE ) injection 5 mg (has no administration  in time range)  acetaminophen  (TYLENOL ) tablet 650 mg (has no administration in time range)  sodium chloride  0.9 % bolus 500 mL (500 mLs Intravenous New Bag/Given 11/15/23 2136)     IMPRESSION / MDM / ASSESSMENT AND PLAN / ED COURSE  I reviewed the triage vital signs and the nursing notes.  DDx: Hip fracture, AKI, actually derangement, dehydration, anemia  Patient's presentation is most consistent with acute presentation with potential threat to life or bodily function.  Patient presents with left hip pain after mechanical fall.  Clinically suspicious for hip fracture.  Will obtain x-ray and labs.  Declines additional pain medicine at this time.  No evidence of head trauma   ----------------------------------------- 9:38 PM on 11/15/2023 ----------------------------------------- Case discussed with orthopedics Dr. Edie who will plan for surgical repair tomorrow  Clinical Course as of 11/15/23 2158  Wed Nov 15, 2023  2157 Case discussed with hospitalist  [PS]     Clinical Course User Index [PS] Viviann Pastor, MD     FINAL CLINICAL IMPRESSION(S) / ED DIAGNOSES   Final diagnoses:  Intertrochanteric fracture of left femur, closed, initial encounter [facility])     Rx / DC Orders   ED Discharge Orders     None        Note:  This document was prepared using Dragon voice recognition software and may include unintentional dictation errors.   Viviann Pastor, MD 11/15/23 2159

## 2023-11-16 ENCOUNTER — Inpatient Hospital Stay: Payer: Self-pay

## 2023-11-16 ENCOUNTER — Inpatient Hospital Stay

## 2023-11-16 ENCOUNTER — Other Ambulatory Visit: Payer: Self-pay

## 2023-11-16 ENCOUNTER — Encounter
Admission: EM | Disposition: A | Discharge status: Skilled Nursing Facility | Payer: Self-pay | Source: Home / Self Care | Attending: Internal Medicine

## 2023-11-16 DIAGNOSIS — W19XXXA Unspecified fall, initial encounter: Secondary | ICD-10-CM

## 2023-11-16 DIAGNOSIS — S72002A Fracture of unspecified part of neck of left femur, initial encounter for closed fracture: Secondary | ICD-10-CM | POA: Diagnosis not present

## 2023-11-16 HISTORY — PX: INTRAMEDULLARY (IM) NAIL INTERTROCHANTERIC: SHX5875

## 2023-11-16 LAB — BASIC METABOLIC PANEL WITH GFR
Anion gap: 6 (ref 5–15)
BUN: 19 mg/dL (ref 8–23)
CO2: 25 mmol/L (ref 22–32)
Calcium: 9 mg/dL (ref 8.9–10.3)
Chloride: 107 mmol/L (ref 98–111)
Creatinine, Ser: 0.78 mg/dL (ref 0.44–1.00)
GFR, Estimated: >60 mL/min (ref >60)
Glucose, Bld: 191 mg/dL — ABNORMAL HIGH (ref 70–99)
Potassium: 4.1 mmol/L (ref 3.5–5.1)
Sodium: 138 mmol/L (ref 135–145)

## 2023-11-16 LAB — CBC
HCT: 38.6 % (ref 36.0–46.0)
Hemoglobin: 12.8 g/dL (ref 12.0–15.0)
MCH: 29.7 pg (ref 26.0–34.0)
MCHC: 33.2 g/dL (ref 30.0–36.0)
MCV: 89.6 fL (ref 80.0–100.0)
Platelets: 150 K/uL (ref 150–400)
RBC: 4.31 MIL/uL (ref 3.87–5.11)
RDW: 12.9 % (ref 11.5–15.5)
WBC: 9.5 K/uL (ref 4.0–10.5)
nRBC: 0 % (ref 0.0–0.2)

## 2023-11-16 SURGERY — FIXATION, FRACTURE, INTERTROCHANTERIC, WITH INTRAMEDULLARY ROD
Anesthesia: General | Laterality: Left

## 2023-11-16 MED ORDER — OXYCODONE HCL 5 MG PO TABS
5.0000 mg | ORAL_TABLET | Freq: Once | ORAL | Status: AC | PRN
  Administered 2023-11-16: 5 mg via ORAL

## 2023-11-16 MED ORDER — SIMVASTATIN 20 MG PO TABS
20.0000 mg | ORAL_TABLET | Freq: Every day | ORAL | Status: DC
  Administered 2023-11-16 – 2023-11-21 (×6): 20 mg via ORAL
  Filled 2023-11-16 (×6): qty 1

## 2023-11-16 MED ORDER — OXYBUTYNIN CHLORIDE 5 MG PO TABS
5.0000 mg | ORAL_TABLET | Freq: Two times a day (BID) | ORAL | Status: DC
  Administered 2023-11-16 – 2023-11-21 (×10): 5 mg via ORAL
  Filled 2023-11-16 (×11): qty 1

## 2023-11-16 MED ORDER — PHENYLEPHRINE HCL-NACL 20-0.9 MG/250ML-% IV SOLN
INTRAVENOUS | Status: AC
  Filled 2023-11-16: qty 250

## 2023-11-16 MED ORDER — OXYCODONE HCL 5 MG PO TABS
ORAL_TABLET | ORAL | Status: AC
  Filled 2023-11-16: qty 1

## 2023-11-16 MED ORDER — ACETAMINOPHEN 10 MG/ML IV SOLN
INTRAVENOUS | Status: DC | PRN
  Administered 2023-11-16: 1000 mg via INTRAVENOUS

## 2023-11-16 MED ORDER — LACTATED RINGERS IV SOLN: INTRAVENOUS | Status: DC | PRN

## 2023-11-16 MED ORDER — LIDOCAINE HCL (CARDIAC) PF 100 MG/5ML IV SOSY
PREFILLED_SYRINGE | INTRAVENOUS | Status: DC | PRN
  Administered 2023-11-16: 100 mg via INTRAVENOUS

## 2023-11-16 MED ORDER — CEFAZOLIN SODIUM-DEXTROSE 2-4 GM/100ML-% IV SOLN
INTRAVENOUS | Status: AC
  Filled 2023-11-16: qty 100

## 2023-11-16 MED ORDER — OXYCODONE HCL 5 MG/5ML PO SOLN: 5.0000 mg | Freq: Once | ORAL | Status: AC | PRN

## 2023-11-16 MED ORDER — BUPIVACAINE LIPOSOME 1.3 % IJ SUSP
INTRAMUSCULAR | Status: DC | PRN
  Administered 2023-11-16: 10 mL

## 2023-11-16 MED ORDER — PHENYLEPHRINE HCL-NACL 20-0.9 MG/250ML-% IV SOLN
INTRAVENOUS | Status: DC | PRN
  Administered 2023-11-16: 30 ug/min via INTRAVENOUS

## 2023-11-16 MED ORDER — ENSURE PLUS HIGH PROTEIN PO LIQD
237.0000 mL | Freq: Two times a day (BID) | ORAL | Status: DC
  Administered 2023-11-17 – 2023-11-18 (×3): 237 mL via ORAL

## 2023-11-16 MED ORDER — FENTANYL CITRATE (PF) 100 MCG/2ML IJ SOLN
INTRAMUSCULAR | Status: AC
  Filled 2023-11-16: qty 2

## 2023-11-16 MED ORDER — GLYCOPYRROLATE 0.2 MG/ML IJ SOLN
INTRAMUSCULAR | Status: DC | PRN
  Administered 2023-11-16: .2 mg via INTRAVENOUS

## 2023-11-16 MED ORDER — PROPOFOL 10 MG/ML IV BOLUS
INTRAVENOUS | Status: DC | PRN
  Administered 2023-11-16: 120 mg via INTRAVENOUS

## 2023-11-16 MED ORDER — CHLORHEXIDINE GLUCONATE 0.12 % MT SOLN
15.0000 mL | Freq: Once | OROMUCOSAL | Status: AC
  Administered 2023-11-16: 15 mL via OROMUCOSAL

## 2023-11-16 MED ORDER — TRANEXAMIC ACID-NACL 1000-0.7 MG/100ML-% IV SOLN
INTRAVENOUS | Status: DC | PRN
  Administered 2023-11-16 (×2): 1000 mg via INTRAVENOUS

## 2023-11-16 MED ORDER — SUGAMMADEX SODIUM 200 MG/2ML IV SOLN
INTRAVENOUS | Status: DC | PRN
  Administered 2023-11-16: 200 mg via INTRAVENOUS

## 2023-11-16 MED ORDER — 0.9 % SODIUM CHLORIDE (POUR BTL) OPTIME
TOPICAL | Status: DC | PRN
  Administered 2023-11-16: 500 mL

## 2023-11-16 MED ORDER — DEXAMETHASONE SODIUM PHOSPHATE 10 MG/ML IJ SOLN
INTRAMUSCULAR | Status: DC | PRN
  Administered 2023-11-16: 5 mg via INTRAVENOUS

## 2023-11-16 MED ORDER — PHENYLEPHRINE 80 MCG/ML (10ML) SYRINGE FOR IV PUSH (FOR BLOOD PRESSURE SUPPORT)
PREFILLED_SYRINGE | INTRAVENOUS | Status: DC | PRN
  Administered 2023-11-16: 80 ug via INTRAVENOUS

## 2023-11-16 MED ORDER — CHLORHEXIDINE GLUCONATE 0.12 % MT SOLN
OROMUCOSAL | Status: AC
  Filled 2023-11-16: qty 15

## 2023-11-16 MED ORDER — ADULT MULTIVITAMIN W/MINERALS CH
1.0000 | ORAL_TABLET | Freq: Every day | ORAL | Status: DC
  Administered 2023-11-17 – 2023-11-21 (×5): 1 via ORAL
  Filled 2023-11-16 (×5): qty 1

## 2023-11-16 MED ORDER — POLYETHYLENE GLYCOL 3350 17 G PO PACK: 34.0000 g | PACK | Freq: Every day | ORAL | Status: DC

## 2023-11-16 MED ORDER — FENTANYL CITRATE (PF) 100 MCG/2ML IJ SOLN
INTRAMUSCULAR | Status: DC | PRN
  Administered 2023-11-16: 50 ug via INTRAVENOUS

## 2023-11-16 MED ORDER — TRANEXAMIC ACID-NACL 1000-0.7 MG/100ML-% IV SOLN
INTRAVENOUS | Status: AC
  Filled 2023-11-16: qty 100

## 2023-11-16 MED ORDER — FUROSEMIDE 20 MG PO TABS: 20.0000 mg | ORAL_TABLET | Freq: Every day | ORAL | Status: DC

## 2023-11-16 MED ORDER — BUPIVACAINE-EPINEPHRINE (PF) 0.5% -1:200000 IJ SOLN
INTRAMUSCULAR | Status: DC | PRN
  Administered 2023-11-16: 20 mL

## 2023-11-16 MED ORDER — SODIUM CHLORIDE 0.9 % IV SOLN: INTRAVENOUS | Status: DC

## 2023-11-16 MED ORDER — BUPIVACAINE-EPINEPHRINE (PF) 0.5% -1:200000 IJ SOLN
INTRAMUSCULAR | Status: AC
  Filled 2023-11-16: qty 10

## 2023-11-16 MED ORDER — KETAMINE HCL 50 MG/5ML IJ SOSY
PREFILLED_SYRINGE | INTRAMUSCULAR | Status: AC
Start: 2023-11-16 — End: 2023-11-16
  Filled 2023-11-16: qty 5

## 2023-11-16 MED ORDER — ONDANSETRON HCL 4 MG/2ML IJ SOLN
INTRAMUSCULAR | Status: DC | PRN
  Administered 2023-11-16: 4 mg via INTRAVENOUS

## 2023-11-16 MED ORDER — ROCURONIUM BROMIDE 100 MG/10ML IV SOLN
INTRAVENOUS | Status: DC | PRN
  Administered 2023-11-16: 20 mg via INTRAVENOUS
  Administered 2023-11-16: 50 mg via INTRAVENOUS

## 2023-11-16 MED ORDER — FENTANYL CITRATE (PF) 100 MCG/2ML IJ SOLN
25.0000 ug | INTRAMUSCULAR | Status: DC | PRN
  Administered 2023-11-16: 25 ug via INTRAVENOUS
  Administered 2023-11-16: 50 ug via INTRAVENOUS
  Administered 2023-11-16: 25 ug via INTRAVENOUS

## 2023-11-16 MED ORDER — CHLORHEXIDINE GLUCONATE CLOTH 2 % EX PADS
6.0000 | MEDICATED_PAD | Freq: Every day | CUTANEOUS | Status: DC
  Administered 2023-11-17: 6 via TOPICAL

## 2023-11-16 MED ORDER — BUPIVACAINE LIPOSOME 1.3 % IJ SUSP
INTRAMUSCULAR | Status: AC
  Filled 2023-11-16: qty 10

## 2023-11-16 MED ORDER — KETAMINE HCL 10 MG/ML IJ SOLN
INTRAMUSCULAR | Status: DC | PRN
  Administered 2023-11-16: 20 mg via INTRAVENOUS
  Administered 2023-11-16: 10 mg via INTRAVENOUS

## 2023-11-16 MED ORDER — ACETAMINOPHEN 10 MG/ML IV SOLN
INTRAVENOUS | Status: AC
  Filled 2023-11-16: qty 100

## 2023-11-16 SURGICAL SUPPLY — 38 items
BIT DRILL 4.3MMS DISTAL GRDTED (BIT) IMPLANT
BNDG COHESIVE 4X5 TAN STRL LF (GAUZE/BANDAGES/DRESSINGS) ×1 IMPLANT
BNDG COHESIVE 6X5 TAN ST LF (GAUZE/BANDAGES/DRESSINGS) ×1 IMPLANT
CHLORAPREP W/TINT 26 (MISCELLANEOUS) ×2 IMPLANT
DRAPE C-ARMOR (DRAPES) ×1 IMPLANT
DRAPE SHEET LG 3/4 BI-LAMINATE (DRAPES) ×1 IMPLANT
DRSG MEPILEX SACRM 8.7X9.8 (GAUZE/BANDAGES/DRESSINGS) ×1 IMPLANT
DRSG OPSITE POSTOP 3X4 (GAUZE/BANDAGES/DRESSINGS) IMPLANT
DRSG OPSITE POSTOP 4X6 (GAUZE/BANDAGES/DRESSINGS) IMPLANT
ELECT CAUTERY BLADE 6.4 (BLADE) ×1 IMPLANT
ELECTRODE REM PT RTRN 9FT ADLT (ELECTROSURGICAL) ×1 IMPLANT
GAUZE XEROFORM 1X8 LF (GAUZE/BANDAGES/DRESSINGS) IMPLANT
GLOVE BIO SURGEON STRL SZ8 (GLOVE) ×2 IMPLANT
GLOVE INDICATOR 8.0 STRL GRN (GLOVE) ×1 IMPLANT
GOWN STRL REUS W/ TWL LRG LVL3 (GOWN DISPOSABLE) ×1 IMPLANT
GOWN STRL REUS W/ TWL XL LVL3 (GOWN DISPOSABLE) ×1 IMPLANT
GUIDEPIN VERSANAIL DSP 3.2X444 (ORTHOPEDIC DISPOSABLE SUPPLIES) IMPLANT
GUIDEWIRE BALL NOSE 100CM (WIRE) IMPLANT
HFN LH 130 DEG 11MM X 340MM (Nail) IMPLANT
HOLDER FOLEY CATH W/STRAP (MISCELLANEOUS) IMPLANT
MANIFOLD NEPTUNE II (INSTRUMENTS) ×1 IMPLANT
MAT ABSORB FLUID 56X50 GRAY (MISCELLANEOUS) ×1 IMPLANT
NDL HYPO 22X1.5 SAFETY MO (MISCELLANEOUS) ×1 IMPLANT
NEEDLE HYPO 22X1.5 SAFETY MO (MISCELLANEOUS) ×1 IMPLANT
NS IRRIG 500ML POUR BTL (IV SOLUTION) ×1 IMPLANT
PACK HIP COMPR (MISCELLANEOUS) ×1 IMPLANT
PENCIL SMOKE EVACUATOR (MISCELLANEOUS) ×1 IMPLANT
SCREW BONE CORTICAL 5.0X42 (Screw) IMPLANT
SCREW LAG HIP FRA NAIL 10.5X90 (Orthopedic Implant) IMPLANT
STAPLER SKIN PROX 35W (STAPLE) ×1 IMPLANT
SUT VIC AB 0 CT1 36 (SUTURE) ×1 IMPLANT
SUT VIC AB 1 CT1 36 (SUTURE) ×1 IMPLANT
SUT VIC AB 2-0 CT1 (SUTURE) ×2 IMPLANT
SYR 30ML LL (SYRINGE) ×1 IMPLANT
TAPE MICROFOAM 4IN (TAPE) IMPLANT
TRAP FLUID SMOKE EVACUATOR (MISCELLANEOUS) ×1 IMPLANT
TRAY FOLEY SLVR 16FR LF STAT (SET/KITS/TRAYS/PACK) IMPLANT
WATER STERILE IRR 500ML POUR (IV SOLUTION) ×1 IMPLANT

## 2023-11-16 NOTE — Plan of Care (Signed)
   Problem: Education: Goal: Knowledge of General Education information will improve Description Including pain rating scale, medication(s)/side effects and non-pharmacologic comfort measures Outcome: Progressing   Problem: Health Behavior/Discharge Planning: Goal: Ability to manage health-related needs will improve Outcome: Progressing

## 2023-11-16 NOTE — Progress Notes (Signed)
 PROGRESS NOTE    Cassandra Gill  FMW:[id number] DOB: [birth year] DOA: 11/15/2023 PCP: Diedra Lame, MD  Outpatient Specialists: none    Brief Narrative:   From admission h and p  Cassandra Gill is a 83 y.o. female with medical history significant of HTN, HLD, stress incontinence, blindness, who presents with fall and left hip pain.   Patient is a deeply blind, lives alone at home.  She states that she was waling and turning and lost her balance and fell.  She developed left hip pain, which is constant, sharp, moderate to severe, nonradiating, aggravated by movement.  She is unable to get up and bear weight on the left leg.  No chest pain, cough, SOB.  No nausea, vomiting, diarrhea or abdominal pain.  No symptoms of UTI.  Denies alcohol drinking.  Patient strongly denies any head or neck injury, she refused CT scan of head and neck.  Assessment & Plan:   Principal Problem:   Closed left hip fracture [facility]) Active Problems:   Fall at home, initial encounter   HTN (hypertension)   HLD (hyperlipidemia)   Blind   Moderate obesity  # Left intertrochanteric hip fracture Neurovascularly intact, cardiac risk not excessive. Surgical repair scheduled for later today - pain control, bowel regimen, pt/ot consults tomorrow - resides independently at facility where SNF is available - outpatient pcp f/u for osteoporosis tx consideration, will check vitamin d  level  # HTN Bp mildly elevated - hold home lasix  for now  # Hyperglycemia No known dm - check a1c  # OAB - home oxybutynin   # Pulmonary infiltrate vs mass Asymptomatic - CT chest tomorrow morning, patient wants to wait until after surgery  # Blind Noted  # Obesity noted   DVT prophylaxis: SCDs Code Status: full Family Communication: sister updated @ bedside 8/28  Level of care: Med-Surg Status is: Inpatient Remains inpatient appropriate because: severity of illness    Consultants:   orthopedics  Procedures: pending  Antimicrobials:  Peri-operative    Subjective: mild  Objective: Vitals:   11/15/23 2129 11/15/23 2300 11/16/23 0313 11/16/23 0747  BP: (!) 151/64 (!) 158/64 (!) 136/57 (!) 151/55  Pulse: 66 72 77 67  Resp: 18 16 15 16   Temp: 98 F (36.7 C) 97.8 F (36.6 C) 99.3 F (37.4 C) 98.5 F (36.9 C)  TempSrc: Oral Oral Oral Oral  SpO2: 98% 100% 99% 100%  Weight:  77.5 kg    Height:  5' (1.524 m)      Intake/Output Summary (Last 24 hours) at 11/16/2023 1305 Last data filed at 11/15/2023 2300 Gross per 24 hour  Intake 50 ml  Output --  Net 50 ml   Filed Weights   11/15/23 2300  Weight: 77.5 kg    Examination:  General exam: Appears calm and comfortable  Respiratory system: Clear to auscultation. Respiratory effort normal. Cardiovascular system: S1 & S2 heard, RRR. Soft systolic murmur Gastrointestinal system: Abdomen is nondistended, soft and nontender. No organomegaly or masses felt.  Central nervous system: Alert and oriented. No focal neurological deficits. Extremities: warm, distal sensation intact Skin: No rashes, lesions or ulcers Psychiatry: Judgement and insight appear normal. Mood & affect appropriate.     Data Reviewed: I have personally reviewed following labs and imaging studies  CBC: Recent Labs  Lab 11/15/23 2050 11/16/23 0507  WBC 10.5 9.5  NEUTROABS 7.4  --   HGB 13.8 12.8  HCT 43.8 38.6  MCV 92.4 89.6  PLT 171 150   Basic Metabolic  Panel: Recent Labs  Lab 11/15/23 2050 11/16/23 0507  NA 138 138  K 4.1 4.1  CL 106 107  CO2 22 25  GLUCOSE 172* 191*  BUN 24* 19  CREATININE 0.64 0.78  CALCIUM 9.6 9.0   GFR: Estimated Creatinine Clearance: 48.2 mL/min (by C-G formula based on SCr of 0.78 mg/dL). Liver Function Tests: No results for input(s): AST, ALT, ALKPHOS, BILITOT, PROT, ALBUMIN in the last 168 hours. No results for input(s): LIPASE, AMYLASE in the last 168 hours. No results  for input(s): AMMONIA in the last 168 hours. Coagulation Profile: Recent Labs  Lab 11/15/23 2231  INR 1.0   Cardiac Enzymes: No results for input(s): CKTOTAL, CKMB, CKMBINDEX, TROPONINI in the last 168 hours. BNP (last 3 results) No results for input(s): PROBNP in the last 8760 hours. HbA1C: No results for input(s): HGBA1C in the last 72 hours. CBG: No results for input(s): GLUCAP in the last 168 hours. Lipid Profile: No results for input(s): CHOL, HDL, LDLCALC, TRIG, CHOLHDL, LDLDIRECT in the last 72 hours. Thyroid  Function Tests: No results for input(s): TSH, T4TOTAL, FREET4, T3FREE, THYROIDAB in the last 72 hours. Anemia Panel: No results for input(s): VITAMINB12, FOLATE, FERRITIN, TIBC, IRON, RETICCTPCT in the last 72 hours. Urine analysis: No results found for: COLORURINE, APPEARANCEUR, LABSPEC, PHURINE, GLUCOSEU, HGBUR, BILIRUBINUR, KETONESUR, PROTEINUR, UROBILINOGEN, NITRITE, LEUKOCYTESUR Sepsis Labs: @LABRCNTIP (procalcitonin:4,lacticidven:4)  )No results found for this or any previous visit (from the past 240 hours).       Radiology Studies: DG Chest 1 View Result Date: 11/15/2023 CLINICAL DATA:  Left hip pain after a fall. EXAM: CHEST  1 VIEW COMPARISON:  None Available. FINDINGS: Heart size and pulmonary vascularity are normal. Right superior perihilar fullness may represent focal consolidation/pneumonia but could indicate lymphadenopathy or mass lesion. CT chest suggested for further evaluation. Left lung is clear. No pleural effusion or pneumothorax. IMPRESSION: 1. Right perihilar and suprahilar opacity may represent consolidation or pneumonia versus lymphadenopathy or mass lesion. CT chest suggested for further evaluation. 2. Left lung is clear. Electronically Signed   By: Elsie Gravely M.D.   On: 11/15/2023 21:38   DG Hip Unilat W or Wo Pelvis 2-3 Views Left Result Date: 11/15/2023 CLINICAL  DATA:  Left hip pain after a fall. EXAM: DG HIP (WITH OR WITHOUT PELVIS) 2-3V LEFT COMPARISON:  None Available. FINDINGS: Acute comminuted inter trochanteric fractures of the proximal left femur with varus angulation. No dislocation at the hip joint. Pelvis appears intact. Previous right hip arthroplasty. Vascular calcifications. Calcified phleboliths in the pelvis. IMPRESSION: Acute comminuted inter trochanteric fractures of the proximal left femur with varus angulation. Electronically Signed   By: Elsie Gravely M.D.   On: 11/15/2023 21:37        Scheduled Meds:  furosemide   20 mg Oral Daily   lidocaine   1 patch Transdermal Q24H   oxybutynin   5 mg Oral BID   simvastatin   20 mg Oral Daily   Continuous Infusions:   ceFAZolin  (ANCEF ) IV       LOS: 1 day     Devaughn KATHEE Ban, MD [facility]   If 7PM-7AM, please contact night-coverage [email or web address] Password [email or web address] 11/16/2023, 1:05 PM

## 2023-11-16 NOTE — Anesthesia Postprocedure Evaluation (Signed)
 Anesthesia Post Note  Patient: Cassandra Gill  Procedure(s) Performed: FIXATION, FRACTURE, INTERTROCHANTERIC, WITH INTRAMEDULLARY ROD (Left)  Patient location during evaluation: PACU Anesthesia Type: General Level of consciousness: awake and alert Pain management: pain level controlled Vital Signs Assessment: post-procedure vital signs reviewed and stable Respiratory status: spontaneous breathing, nonlabored ventilation, respiratory function stable and patient connected to nasal cannula oxygen Cardiovascular status: blood pressure returned to baseline and stable Postop Assessment: no apparent nausea or vomiting Anesthetic complications: no   No notable events documented.   Last Vitals:  Vitals:   11/16/23 1830 11/16/23 1845  BP: (!) 133/58 (!) 126/54  Pulse: 69 67  Resp: 12 12  Temp: (!) 36.3 C   SpO2: 99% 100%    Last Pain:  Vitals:   11/16/23 1830  TempSrc:   PainSc: Asleep                 [name]

## 2023-11-16 NOTE — Plan of Care (Signed)

## 2023-11-16 NOTE — Transfer of Care (Signed)
 Immediate Anesthesia Transfer of Care Note  Patient: Cassandra Gill  Procedure(s) Performed: FIXATION, FRACTURE, INTERTROCHANTERIC, WITH INTRAMEDULLARY ROD (Left)  Patient Location: PACU  Anesthesia Type:General  Level of Consciousness: drowsy  Airway & Oxygen Therapy: Patient Spontanous Breathing and Patient connected to face mask oxygen  Post-op Assessment: Report given to RN  Post vital signs: stable  Last Vitals:  Vitals Value Taken Time  BP 157/64 11/16/23 17:40  Temp    Pulse 86 11/16/23 17:43  Resp 14 11/16/23 17:43  SpO2 100 % 11/16/23 17:43  Vitals shown include unfiled device data.  Last Pain:  Vitals:   11/16/23 1536  TempSrc: Temporal  PainSc: 0-No pain         Complications: No notable events documented.

## 2023-11-16 NOTE — Anesthesia Preprocedure Evaluation (Addendum)
 Anesthesia Evaluation  Patient identified by MRN, date of birth, ID band Patient awake    Reviewed: Allergy & Precautions, H&P , NPO status , Patient's Chart, lab work & pertinent test results  History of Anesthesia Complications Negative for: history of anesthetic complications  Airway Mallampati: III  TM Distance: <3 FB Neck ROM: full    Dental no notable dental hx. (+) Chipped   Pulmonary neg pulmonary ROS, neg shortness of breath   Pulmonary exam normal        Cardiovascular Exercise Tolerance: Good hypertension, (-) angina (-) Past MI Normal cardiovascular exam     Neuro/Psych negative neurological ROS  negative psych ROS   GI/Hepatic negative GI ROS, Neg liver ROS,neg GERD  ,,  Endo/Other  negative endocrine ROS    Renal/GU      Musculoskeletal   Abdominal   Peds  Hematology negative hematology ROS (+)   Anesthesia Other Findings Active Problems:   Fall at home, initial encounter   HTN (hypertension)   HLD (hyperlipidemia)   Blind   Moderate obesity   # Left intertrochanteric hip fracture Neurovascularly intact, cardiac risk not excessive. Surgical repair scheduled for later today - pain control, bowel regimen, pt/ot consults tomorrow - resides independently at facility where SNF is available - outpatient pcp f/u for osteoporosis tx consideration, will check vitamin d  level   # HTN Bp mildly elevated - hold home lasix  for now   # Hyperglycemia No known dm - check a1c   # OAB - home oxybutynin    # Pulmonary infiltrate vs mass Asymptomatic - CT chest    # Blind Noted   # Obesity noted   Body Mass Index: 33.37 kg/m      Reproductive/Obstetrics negative OB ROS                              Anesthesia Physical Anesthesia Plan  ASA: 2  Anesthesia Plan: General ETT   Post-op Pain Management:    Induction: Intravenous  PONV Risk Score and Plan: 2 and  Ondansetron  and Dexamethasone   Airway Management Planned: Oral ETT  Additional Equipment:   Intra-op Plan:   Post-operative Plan: Extubation in OR  Informed Consent: I have reviewed the patients History and Physical, chart, labs and discussed the procedure including the risks, benefits and alternatives for the proposed anesthesia with the patient or authorized representative who has indicated his/her understanding and acceptance.     Dental Advisory Given  Plan Discussed with: CRNA and Surgeon  Anesthesia Plan Comments: (Patient and daughter consented for risks of anesthesia including but not limited to:  - adverse reactions to medications - damage to eyes, teeth, lips or other oral mucosa - nerve damage due to positioning  - sore throat or hoarseness - Damage to heart, brain, nerves, lungs, other parts of body or loss of life  They voiced understanding and assent.)         Anesthesia Quick Evaluation

## 2023-11-16 NOTE — Progress Notes (Signed)
 Initial Nutrition Assessment  DOCUMENTATION CODES:   Obesity unspecified  INTERVENTION:   -Once diet is advanced, add:   -Ensure Plus High Protein po BID, each supplement provides 350 kcal and 20 grams of protein  -MVI with minerals daily  NUTRITION DIAGNOSIS:   Increased nutrient needs related to post-op healing as evidenced by estimated needs.  GOAL:   Patient will meet greater than or equal to 90% of their needs  MONITOR:   PO intake, Supplement acceptance  REASON FOR ASSESSMENT:   Consult Assessment of nutrition requirement/status, Hip fracture protocol  ASSESSMENT:   Pt with medical history significant of HTN, HLD, stress incontinence, blindness, who presents with fall and left hip pain.  Pt admitted with closed lt hip fracture.  Reviewed I/O's: +50 ml x 24 hours  Per chart review, pt is blind and lives at home alone.   Pt is NPO for procedure today. Orthopedics consult pending.   Pt on the phone at time of visit. No family present. Unable to obtain further history at this time.   Noted pt with distant mild wt loss.   Pt with increased nutritional needs for post-op healing and would benefit from addition of oral nutrition supplements.   Medications reviewed and include miralax .   Labs reviewed.    NUTRITION - FOCUSED PHYSICAL EXAM:  Flowsheet Row Most Recent Value  Orbital Region No depletion  Upper Arm Region No depletion  Thoracic and Lumbar Region No depletion  Buccal Region No depletion  Temple Region No depletion  Clavicle Bone Region No depletion  Clavicle and Acromion Bone Region No depletion  Scapular Bone Region No depletion  Dorsal Hand No depletion  Patellar Region No depletion  Anterior Thigh Region No depletion  Posterior Calf Region No depletion  Edema (RD Assessment) None  Hair Reviewed  Eyes Reviewed  Mouth Reviewed  Skin Reviewed  Nails Reviewed    Diet Order:   Diet Order             Diet NPO time specified Except  for: Sips with Meds, Ice Chips  Diet effective midnight                   EDUCATION NEEDS:   No education needs have been identified at this time  Skin:  Skin Assessment: Reviewed RN Assessment  Last BM:  11/15/23  Height:   Ht Readings from Last 1 Encounters:  11/15/23 5' (1.524 m)    Weight:   Wt Readings from Last 1 Encounters:  11/15/23 77.5 kg    Ideal Body Weight:  45.5 kg  BMI:  Body mass index is 33.37 kg/m.  Estimated Nutritional Needs:   Kcal:  1400-1600  Protein:  65-90 grams  Fluid:  1.4-1.6 L    Margery ORN, RD, LDN, CDCES Registered Dietitian III Certified Diabetes Care and Education Specialist If unable to reach this RD, please use RD Inpatient group chat on secure chat between hours of 8am-4 pm daily

## 2023-11-16 NOTE — Op Note (Signed)
 11/15/2023 - 11/16/2023  4:04 PM  Patient:   Cassandra Gill  Pre-Op Diagnosis:   Closed displaced intertrochanteric fracture left hip.  Post-Op Diagnosis:   Same  Procedure:   Reduction and internal fixation of placed intertrochanteric left hip fracture with Biomet Affixis TFN nail.  Surgeon:   DOROTHA Reyes Maltos, MD  Assistant:   None  Anesthesia:   GET  Findings:   As above  Complications:   None  EBL:   100 cc  Fluids:   700 cc crystalloid  UOP:   100 cc  TT:   None  Drains:   None  Closure:   Staples  Implants:   Biomet Affixis 11 x 340 mm TFN with a 90 mm lag screw and a 42 mm distal interlocking screw  Brief Clinical Note:   The patient is an 83 year old female who sustained the above-noted injury yesterday afternoon when she apparently lost her balance and fell while in her apartment at [facility]. The patient was brought to the emergency room where x-rays demonstrated the above-noted injury. The patient has been cleared medically and presents at this time for reduction and internal fixation of the displaced intertrochanteric left hip fracture.  Procedure:   The patient was brought into the operating room and laid in the supine position. After adequate general endotracheal intubation and anesthesia was obtained, the patient was repositioned on the fracture table. The uninjured leg was placed in a flexed and abducted position while the injured lower extremity was placed in longitudinal traction. The fracture was reduced using longitudinal traction and internal rotation. The adequacy of reduction was verified fluoroscopically in AP and lateral projections and found to be near anatomic. The lateral aspects of the left hip and thigh were prepped with ChloraPrep solution before being draped sterilely. Preoperative antibiotics were administered. A timeout was performed to verify the appropriate surgical site.   The greater trochanter was identified fluoroscopically and an  approximately 3 cm incision made about 2-3 fingerbreadths above the tip of the greater trochanter. The incision was carried down through the subcutaneous tissues to expose the gluteal fascia. This was split the length of the incision, providing access to the tip of the trochanter. Under fluoroscopic guidance, a guidewire was drilled through the tip of the trochanter into the proximal metaphysis to the level of the lesser trochanter. After verifying its position fluoroscopically in AP and lateral projections, it was overreamed with the initial reamer to the depth of the lesser trochanter. A guidewire was passed down through the femoral canal to the supracondylar region. The adequacy of guidewire position was verified fluoroscopically in AP and lateral projections before the length of the guidewire within the canal was measured and found to be 355 mm. Therefore, a 340 mm length nail was selected. The guidewire was overreamed sequentially using the flexible reamers, beginning with a 10.5 mm reamer and progressing to a 12.5 mm reamer. This provided good cortical chatter. The 11 x 340 mm Biomet Affixis TFN rod was selected and advanced to the appropriate depth, as verified fluoroscopically.   The guide system for the lag screw was positioned and advanced through an approximately 2 cm stab incision over the lateral aspect of the proximal femur. The guidewire was drilled up through the trochanteric femoral nail and into the femoral neck to rest within 5 mm of subchondral bone. After verifying its position in the femoral neck and head in both AP and lateral projections, the guidewire was measured and found to be optimally replicated  by a 90 mm lag screw. The guidewire was overreamed to the appropriate depth before the lag screw was inserted and advanced to the appropriate depth as verified fluoroscopically in AP and lateral projections. The locking screw was advanced, then backed off a quarter turn to set the lag screw.  Again the adequacy of hardware position and fracture reduction was verified fluoroscopically in AP and lateral projections and found to be excellent.  Attention was directed distally. Using the perfect circle technique, the leg and fluoroscopy machine were positioned appropriately. An approximately 1.5 cm stab incision was made over the skin at the appropriate point before the drill bit was advanced through the cortex and across the static hole of the nail. The appropriate length of the screw was determined before the 42 mm distal interlocking screw was positioned, then advanced and tightened securely. Again the adequacy of screw position was verified fluoroscopically in AP and lateral projections and found to be excellent.  The wounds were irrigated thoroughly with sterile saline solution before the abductor fascia was reapproximated using #1 Vicryl interrupted sutures. The subcutaneous tissues were closed using 2-0 Vicryl interrupted sutures. The skin was closed using staples. A solution of 10 cc of Exparel  plus 20 cc of 0.5% Sensorcaine  with epinephrine  was injected in and around all incisions. Sterile occlusive dressings were applied to all wounds before the patient was awakened, extubated, and returned to the recovery room in satisfactory condition after tolerating the procedure well.

## 2023-11-16 NOTE — Consult Note (Signed)
 ORTHOPAEDIC CONSULTATION  REQUESTING PHYSICIAN: Wouk, Devaughn Sayres, MD  Chief Complaint:   Left hip pain.  History of Present Illness: Cassandra Gill is an 83 y.o. female with a history of hypertension, hypercholesterolemia, and blindness who normally lives independently.  Apparently, last evening while walking in her home, she lost her balance and fell while turning, landing on her left hip.  She was brought to the emergency room where x-rays demonstrated a displaced intertrochanteric fracture of the left hip.  The patient has been admitted for definitive management of this injury.  The patient denies any associated injuries.  She did not strike her head or lose consciousness.  The patient also denies any lightheadedness, dizziness, chest pain, shortness of breath, or other symptoms which may have precipitated her fall.  Past Medical History:  Diagnosis Date   Blind    High cholesterol    HTN (hypertension)    Past Surgical History:  Procedure Laterality Date   TOTAL HIP ARTHROPLASTY     Social History   Socioeconomic History   Marital status: Married    Spouse name: Not on file   Number of children: Not on file   Years of education: Not on file   Highest education level: Not on file  Occupational History   Not on file  Tobacco Use   Smoking status: Never   Smokeless tobacco: Never  Vaping Use   Vaping status: Never Used  Substance and Sexual Activity   Alcohol use: Yes    Comment: socially/occass   Drug use: Never   Sexual activity: Not Currently  Other Topics Concern   Not on file  Social History Narrative   Not on file   Social Drivers of Health   Financial Resource Strain: Not on file  Food Insecurity: No Food Insecurity (11/16/2023)   Hunger Vital Sign    Worried About Running Out of Food in the Last Year: Never true    Ran Out of Food in the Last Year: Never true  Transportation Needs: No  Transportation Needs (11/15/2023)   PRAPARE - Administrator, Civil Service (Medical): No    Lack of Transportation (Non-Medical): No  Physical Activity: Not on file  Stress: Not on file  Social Connections: Moderately Integrated (11/16/2023)   Social Connection and Isolation Panel    Frequency of Communication with Friends and Family: Three times a week    Frequency of Social Gatherings with Friends and Family: Three times a week    Attends Religious Services: More than 4 times per year    Active Member of Clubs or Organizations: Yes    Attends Banker Meetings: 1 to 4 times per year    Marital Status: Widowed   Family History  Problem Relation Age of Onset   Breast cancer Maternal Aunt 83   Breast cancer Cousin        maternal side <50   No Known Allergies Prior to Admission medications   Medication Sig Start Date End Date Taking? Authorizing Provider  furosemide  (LASIX ) 20 MG tablet furosemide  20 mg tablet   Yes [provider]  simvastatin  (ZOCOR ) 20 MG tablet Take 20 mg by mouth daily.   Yes [provider]  oxybutynin  (DITROPAN ) 5 MG tablet oxybutynin  chloride 5 mg tablet Patient not taking: Reported on 11/16/2023    [provider]   DG Chest 1 View Result Date: 11/15/2023 CLINICAL DATA:  Left hip pain after a fall. EXAM: CHEST  1 VIEW COMPARISON:  None Available. FINDINGS: Heart size and pulmonary vascularity are normal. Right superior perihilar fullness may represent focal consolidation/pneumonia but could indicate lymphadenopathy or mass lesion. CT chest suggested for further evaluation. Left lung is clear. No pleural effusion or pneumothorax. IMPRESSION: 1. Right perihilar and suprahilar opacity may represent consolidation or pneumonia versus lymphadenopathy or mass lesion. CT chest suggested for further evaluation. 2. Left lung is clear. Electronically Signed   By: Elsie Gravely M.D.   On: 11/15/2023 21:38   DG Hip Unilat  W or Wo Pelvis 2-3 Views Left Result Date: 11/15/2023 CLINICAL DATA:  Left hip pain after a fall. EXAM: DG HIP (WITH OR WITHOUT PELVIS) 2-3V LEFT COMPARISON:  None Available. FINDINGS: Acute comminuted inter trochanteric fractures of the proximal left femur with varus angulation. No dislocation at the hip joint. Pelvis appears intact. Previous right hip arthroplasty. Vascular calcifications. Calcified phleboliths in the pelvis. IMPRESSION: Acute comminuted inter trochanteric fractures of the proximal left femur with varus angulation. Electronically Signed   By: Elsie Gravely M.D.   On: 11/15/2023 21:37    Positive ROS: All other systems have been reviewed and were otherwise negative with the exception of those mentioned in the HPI and as above.  Physical Exam: General:  Alert, no acute distress Psychiatric:  Patient is competent for consent with normal mood and affect   Cardiovascular:  No pedal edema Respiratory:  No wheezing, non-labored breathing GI:  Abdomen is soft and non-tender Skin:  No lesions in the area of chief complaint Neurologic:  Sensation intact distally Lymphatic:  No axillary or cervical lymphadenopathy  Orthopedic Exam:  Orthopedic examination is limited to the left hip and lower extremity.  The left lower extremity is somewhat shortened and externally rotated as compared to the right.  Skin inspection of the left hip is unremarkable.  No swelling, erythema, ecchymosis, abrasions, or other skin abnormalities are identified.  She has mild-moderate tenderness to palpation over the lateral aspect of the hip.  She has more severe pain with any attempted active or passive motion of the hip or left lower extremity.  She is grossly neurovascularly intact to the left lower extremity and foot.  X-rays:  Recent x-rays of the pelvis and left hip are available for review and have been reviewed by myself.  These films demonstrate a displaced intertrochanteric fracture of the left hip.   No significant degenerative changes of the left hip joint are noted.  Incidental note is made of what appears to be a well-positioned right total hip arthroplasty which is without evidence of loosening or malposition.  No other acute bony abnormalities are identified.  Assessment: Displaced intertrochanteric fracture left hip.  Plan: The treatment options, including both surgical and nonsurgical choices, have been discussed in detail with the patient and her family.  The patient would like to proceed with surgical intervention to include an intramedullary nailing of the displaced intertrochanteric fracture of her left hip.  The risks (including bleeding, infection, nerve and/or blood vessel injury, persistent or recurrent pain, loosening or failure of the components, leg length inequality, malunion and/or nonunion, need for further surgery, blood clots, strokes, heart attacks or arrhythmias, pneumonia, etc.) and benefits of the surgical procedure were discussed.  The patient states her understanding and agrees to proceed.  A formal written consent will be obtained by the nursing staff.  Thank you for asked me to participate in the care of this most pleasant unfortunate woman.  I will be happy to follow her with you.  DOROTHA Reyes Maltos, MD  Beeper #:  [phone]  11/16/2023 7:33 AM

## 2023-11-16 NOTE — Anesthesia Procedure Notes (Signed)
 Procedure Name: Intubation Date/Time: 11/16/2023 4:03 PM  Performed by: Rosine Shona Jansky, CRNAPre-anesthesia Checklist: Patient identified, Emergency Drugs available, Suction available and Patient being monitored Patient Re-evaluated:Patient Re-evaluated prior to induction Oxygen Delivery Method: Circle system utilized Preoxygenation: Pre-oxygenation with 100% oxygen Induction Type: IV induction and Cricoid Pressure applied Ventilation: Mask ventilation without difficulty Laryngoscope Size: McGrath and 3 Grade View: Grade I Tube type: Oral Number of attempts: 1 Airway Equipment and Method: Stylet and Oral airway Placement Confirmation: ETT inserted through vocal cords under direct vision, positive ETCO2 and breath sounds checked- equal and bilateral Secured at: 21 cm Tube secured with: Tape Dental Injury: Teeth and Oropharynx as per pre-operative assessment  Comments: Slightly anterior.  Good visualization with cricoid pressure.  Atraumatic attempt x1.

## 2023-11-17 ENCOUNTER — Encounter: Payer: Self-pay | Admitting: Surgery

## 2023-11-17 ENCOUNTER — Inpatient Hospital Stay

## 2023-11-17 DIAGNOSIS — S72002A Fracture of unspecified part of neck of left femur, initial encounter for closed fracture: Secondary | ICD-10-CM | POA: Diagnosis not present

## 2023-11-17 LAB — URINALYSIS, COMPLETE (UACMP) WITH MICROSCOPIC
Bilirubin Urine: NEGATIVE
Glucose, UA: >=500 mg/dL — AB
Ketones, ur: NEGATIVE mg/dL
Nitrite: NEGATIVE
Protein, ur: 30 mg/dL — AB
Specific Gravity, Urine: 1.026 (ref 1.005–1.030)
WBC, UA: >50 WBC/hpf (ref 0–5)
pH: 5 (ref 5.0–8.0)

## 2023-11-17 LAB — CBC
HCT: 36.7 % (ref 36.0–46.0)
Hemoglobin: 12 g/dL (ref 12.0–15.0)
MCH: 29.9 pg (ref 26.0–34.0)
MCHC: 32.7 g/dL (ref 30.0–36.0)
MCV: 91.5 fL (ref 80.0–100.0)
Platelets: 129 K/uL — ABNORMAL LOW (ref 150–400)
RBC: 4.01 MIL/uL (ref 3.87–5.11)
RDW: 13.2 % (ref 11.5–15.5)
WBC: 9 K/uL (ref 4.0–10.5)
nRBC: 0 % (ref 0.0–0.2)

## 2023-11-17 LAB — BASIC METABOLIC PANEL WITH GFR
Anion gap: 6 (ref 5–15)
BUN: 16 mg/dL (ref 8–23)
CO2: 25 mmol/L (ref 22–32)
Calcium: 8.5 mg/dL — ABNORMAL LOW (ref 8.9–10.3)
Chloride: 108 mmol/L (ref 98–111)
Creatinine, Ser: 0.55 mg/dL (ref 0.44–1.00)
GFR, Estimated: >60 mL/min (ref >60)
Glucose, Bld: 194 mg/dL — ABNORMAL HIGH (ref 70–99)
Potassium: 4.2 mmol/L (ref 3.5–5.1)
Sodium: 139 mmol/L (ref 135–145)

## 2023-11-17 LAB — VITAMIN D 25 HYDROXY (VIT D DEFICIENCY, FRACTURES): Vit D, 25-Hydroxy: 15.73 ng/mL — ABNORMAL LOW (ref 30–100)

## 2023-11-17 MED ORDER — SENNA 8.6 MG PO TABS
1.0000 | ORAL_TABLET | Freq: Every day | ORAL | Status: DC
  Administered 2023-11-17 – 2023-11-20 (×3): 8.6 mg via ORAL
  Filled 2023-11-17 (×5): qty 1

## 2023-11-17 MED ORDER — ENOXAPARIN SODIUM 40 MG/0.4ML IJ SOSY
40.0000 mg | PREFILLED_SYRINGE | INTRAMUSCULAR | Status: DC
  Administered 2023-11-17 – 2023-11-19 (×3): 40 mg via SUBCUTANEOUS
  Filled 2023-11-17 (×3): qty 0.4

## 2023-11-17 MED ORDER — OXYCODONE HCL 5 MG PO TABS
5.0000 mg | ORAL_TABLET | Freq: Four times a day (QID) | ORAL | Status: DC | PRN
  Administered 2023-11-17 – 2023-11-20 (×4): 10 mg via ORAL
  Filled 2023-11-17 (×5): qty 2

## 2023-11-17 MED ORDER — POLYETHYLENE GLYCOL 3350 17 G PO PACK
34.0000 g | PACK | Freq: Every day | ORAL | Status: DC
  Administered 2023-11-17 – 2023-11-20 (×2): 34 g via ORAL
  Filled 2023-11-17 (×5): qty 2

## 2023-11-17 NOTE — Progress Notes (Signed)
 PROGRESS NOTE    Cassandra Gill  FMW:[id number] DOB: 08-16-1940 DOA: 11/15/2023 PCP: Diedra Lame, MD  Outpatient Specialists: none    Brief Narrative:   From admission h and p  Cassandra Gill is a 83 y.o. female with medical history significant of HTN, HLD, stress incontinence, blindness, who presents with fall and left hip pain.   Patient is a deeply blind, lives alone at home.  She states that she was waling and turning and lost her balance and fell.  She developed left hip pain, which is constant, sharp, moderate to severe, nonradiating, aggravated by movement.  She is unable to get up and bear weight on the left leg.  No chest pain, cough, SOB.  No nausea, vomiting, diarrhea or abdominal pain.  No symptoms of UTI.  Denies alcohol drinking.  Patient strongly denies any head or neck injury, she refused CT scan of head and neck.  Assessment & Plan:   Principal Problem:   Closed left hip fracture [facility]) Active Problems:   Fall at home, initial encounter   HTN (hypertension)   HLD (hyperlipidemia)   Blind   Moderate obesity  # Left intertrochanteric hip fracture S/p surgical repair on 8/28 with dr. Edie, no complications. Hgb wnl today - pain control, bowel regimen, pt/ot consults - resides independently at facility where she says SNF is available - lovenox  for dvt ppx per ortho, duration TBD - WBAT left leg - outpatient pcp f/u for osteoporosis tx consideration, will check vitamin d  level  # Thrombocytopenia Mild, new today 129 - trend for now  # HTN Bp wnl - hold home lasix  for now  # Hyperglycemia No known dm - f/u a1c  # OAB - home oxybutynin   # Pulmonary infiltrate vs mass On cxr. Asymptomatic. CT today no suspicious lesions  # Goiter - has outpt u/s scheduled for next week  # Blind Noted  # Obesity noted   DVT prophylaxis: lovenox  Code Status: full Family Communication: sister updated @ bedside 8/28. None at bedside today, message left with  patient  Level of care: Med-Surg Status is: Inpatient Remains inpatient appropriate because: severity of illness    Consultants:  orthopedics  Procedures: pending  Antimicrobials:  Peri-operative    Subjective: Moderate hip pain  Objective: Vitals:   11/16/23 2334 11/17/23 0511 11/17/23 0839 11/17/23 1247  BP: (!) 128/58 (!) 126/40 133/65 127/71  Pulse: 61 (!) 54 65 73  Resp: 18 17 16 15   Temp: 97.6 F (36.4 C) 97.8 F (36.6 C) 97.6 F (36.4 C) 97.8 F (36.6 C)  TempSrc: Oral   Oral  SpO2: 99% 100% 99% 99%  Weight:      Height:        Intake/Output Summary (Last 24 hours) at 11/17/2023 1315 Last data filed at 11/17/2023 0900 Gross per 24 hour  Intake 2138.75 ml  Output 725 ml  Net 1413.75 ml   Filed Weights   11/15/23 2300 11/16/23 1536  Weight: 77.5 kg 78.5 kg    Examination:  General exam: Appears calm and comfortable  Respiratory system: Clear to auscultation. Respiratory effort normal. Cardiovascular system: S1 & S2 heard, RRR. Soft systolic murmur Gastrointestinal system: Abdomen is nondistended, soft and nontender. No organomegaly or masses felt.  Central nervous system: Alert and oriented. No focal neurological deficits. Extremities: warm, distal sensation intact Skin: c/d/I bandages left hip Psychiatry: Judgement and insight appear normal. Mood & affect appropriate.     Data Reviewed: I have personally reviewed following labs and imaging  studies  CBC: Recent Labs  Lab 11/15/23 2050 11/16/23 0507 11/17/23 0405  WBC 10.5 9.5 9.0  NEUTROABS 7.4  --   --   HGB 13.8 12.8 12.0  HCT 43.8 38.6 36.7  MCV 92.4 89.6 91.5  PLT 171 150 129*   Basic Metabolic Panel: Recent Labs  Lab 11/15/23 2050 11/16/23 0507 11/17/23 0405  NA 138 138 139  K 4.1 4.1 4.2  CL 106 107 108  CO2 22 25 25   GLUCOSE 172* 191* 194*  BUN 24* 19 16  CREATININE 0.64 0.78 0.55  CALCIUM 9.6 9.0 8.5*   GFR: Estimated Creatinine Clearance: 48.5 mL/min (by C-G  formula based on SCr of 0.55 mg/dL). Liver Function Tests: No results for input(s): AST, ALT, ALKPHOS, BILITOT, PROT, ALBUMIN in the last 168 hours. No results for input(s): LIPASE, AMYLASE in the last 168 hours. No results for input(s): AMMONIA in the last 168 hours. Coagulation Profile: Recent Labs  Lab 11/15/23 2231  INR 1.0   Cardiac Enzymes: No results for input(s): CKTOTAL, CKMB, CKMBINDEX, TROPONINI in the last 168 hours. BNP (last 3 results) No results for input(s): PROBNP in the last 8760 hours. HbA1C: No results for input(s): HGBA1C in the last 72 hours. CBG: No results for input(s): GLUCAP in the last 168 hours. Lipid Profile: No results for input(s): CHOL, HDL, LDLCALC, TRIG, CHOLHDL, LDLDIRECT in the last 72 hours. Thyroid  Function Tests: No results for input(s): TSH, T4TOTAL, FREET4, T3FREE, THYROIDAB in the last 72 hours. Anemia Panel: No results for input(s): VITAMINB12, FOLATE, FERRITIN, TIBC, IRON, RETICCTPCT in the last 72 hours. Urine analysis: No results found for: COLORURINE, APPEARANCEUR, LABSPEC, PHURINE, GLUCOSEU, HGBUR, BILIRUBINUR, KETONESUR, PROTEINUR, UROBILINOGEN, NITRITE, LEUKOCYTESUR Sepsis Labs: @LABRCNTIP (procalcitonin:4,lacticidven:4)  )No results found for this or any previous visit (from the past 240 hours).       Radiology Studies: CT CHEST WO CONTRAST Result Date: 11/17/2023 EXAM: CT CHEST WITHOUT CONTRAST 11/17/2023 07:20:51 AM TECHNIQUE: CT of the chest was performed without the administration of intravenous contrast. Multiplanar reformatted images are provided for review. Automated exposure control, iterative reconstruction, and/or weight based adjustment of the mA/kV was utilized to reduce the radiation dose to as low as reasonably achievable. COMPARISON: 11/15/2023 chest radiograph CLINICAL HISTORY: Pulmonary mass. FINDINGS: MEDIASTINUM AND LYMPH  NODES: Left anterior descending coronary atherosclerosis. Atherosclerotic nonaneurysmal thoracic aorta. Multinodular goiter with dominant hypodense 2.9 cm left thyroid  nodule. LUNGS AND PLEURA: Trace layering bilateral pleural effusions. Subsegmental atelectasis in the anterior right upper lobe and in the dependent mid to lower lungs bilaterally. No acute consolidative airspace disease, lung masses or significant pulmonary nodules. 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 Flowing syndesmophytes throughout the thoracic spine. UPPER ABDOMEN: Limited images of the upper abdomen demonstrates no acute abnormality. IMPRESSION: 1. Mild bilateral subsegmental atelectasis and trace bilateral pleural effusions. 2. No lung mass or adenopathy. 3. Multinodular goiter with dominant 2.9 cm hypodense left thyroid  nodule; correlate with 01/04/2022 thyroid  ultrasound report. Electronically signed by: Selinda Blue MD 11/17/2023 09:25 AM EDT RP Workstation: [id number]   DG HIP UNILAT WITH PELVIS 2-3 VIEWS LEFT Result Date: 11/16/2023 CLINICAL DATA:  ORIF of the left femur. EXAM: DG HIP (WITH OR WITHOUT PELVIS) 2-3V LEFT COMPARISON:  Radiograph dated 11/15/2023. FINDINGS: Four intraoperative fluoroscopic spot images provided. The total fluoroscopic time is 1 minute 3 seconds with a cumulative air Karma of 33.4 mGy. Status post ORIF of the left femur. IMPRESSION: Status post ORIF of  the left femur. Electronically Signed   By: Vanetta Chou M.D.   On: 11/16/2023 17:27   DG C-Arm 1-60 Min-No Report Result Date: 11/16/2023 Fluoroscopy was utilized by the requesting physician.  No radiographic interpretation.   DG Chest 1 View Result Date: 11/15/2023 CLINICAL DATA:  Left hip pain after a fall. EXAM: CHEST  1 VIEW COMPARISON:  None Available. FINDINGS: Heart size and pulmonary vascularity are normal. Right superior perihilar fullness may represent focal consolidation/pneumonia but  could indicate lymphadenopathy or mass lesion. CT chest suggested for further evaluation. Left lung is clear. No pleural effusion or pneumothorax. IMPRESSION: 1. Right perihilar and suprahilar opacity may represent consolidation or pneumonia versus lymphadenopathy or mass lesion. CT chest suggested for further evaluation. 2. Left lung is clear. Electronically Signed   By: Elsie Gravely M.D.   On: 11/15/2023 21:38   DG Hip Unilat W or Wo Pelvis 2-3 Views Left Result Date: 11/15/2023 CLINICAL DATA:  Left hip pain after a fall. EXAM: DG HIP (WITH OR WITHOUT PELVIS) 2-3V LEFT COMPARISON:  None Available. FINDINGS: Acute comminuted inter trochanteric fractures of the proximal left femur with varus angulation. No dislocation at the hip joint. Pelvis appears intact. Previous right hip arthroplasty. Vascular calcifications. Calcified phleboliths in the pelvis. IMPRESSION: Acute comminuted inter trochanteric fractures of the proximal left femur with varus angulation. Electronically Signed   By: Elsie Gravely M.D.   On: 11/15/2023 21:37        Scheduled Meds:  Chlorhexidine  Gluconate Cloth  6 each Topical Daily   enoxaparin  (LOVENOX ) injection  40 mg Subcutaneous Q24H   feeding supplement  237 mL Oral BID BM   multivitamin with minerals  1 tablet Oral Daily   oxybutynin   5 mg Oral BID   simvastatin   20 mg Oral Daily   Continuous Infusions:  sodium chloride  75 mL/hr at 11/16/23 1915     LOS: 2 days     Devaughn KATHEE Ban, MD [facility]   If 7PM-7AM, please contact night-coverage [email or web address] Password [email or web address] 11/17/2023, 1:15 PM

## 2023-11-17 NOTE — TOC Initial Note (Signed)
 Transition of Care [facility]) - Initial/Assessment Note    Patient Details  Name: Cassandra Gill MRN: [id number] Date of Birth: 16-Jul-1940  Transition of Care [facility]) CM/SW Contact:    [place] L Hanish Laraia, LCSW Phone Number: 11/17/2023, 12:54 PM  Clinical Narrative:                    CSW met with patient. Daughter was at bedside. Patient resides at the [facility] [place]. Patient advised that she will follow the recommendations and go to rehab at [facility] [place].   CSW spoke with Suzen Page who confirmed that when patient is medically ready for discharge, they will locate a bed for her.        Patient Goals and CMS Choice            Expected Discharge Plan and Services                                              Prior Living Arrangements/Services                       Activities of Daily Living   ADL Screening (condition at time of admission) Independently performs ADLs?: Yes (appropriate for developmental age) Is the patient deaf or have difficulty hearing?: No Does the patient have difficulty seeing, even when wearing glasses/contacts?: Yes Does the patient have difficulty concentrating, remembering, or making decisions?: No  Permission Sought/Granted                  Emotional Assessment              Admission diagnosis:  Closed left hip fracture (HCC) [S72.002A] Intertrochanteric fracture of left femur, closed, initial encounter [facility]) [S72.142A] Patient Active Problem List   Diagnosis Date Noted   Fall at home, initial encounter 11/16/2023   Closed left hip fracture (HCC) 11/15/2023   HLD (hyperlipidemia)    HTN (hypertension)    Blind    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 [place]  [address] Phone: [phone] Fax: [phone]     Social Drivers of Health (SDOH) Social History: SDOH Screenings   Food Insecurity: No Food Insecurity (11/16/2023)  Housing: Low Risk  (11/16/2023)  Transportation Needs: No Transportation Needs (11/15/2023)  Utilities: Not At Risk (11/15/2023)  Depression (PHQ2-9): Low Risk  (07/13/2021)  Social Connections: Moderately Integrated (11/16/2023)  Tobacco Use: Low Risk  (07/26/2023)   SDOH Interventions:     Readmission Risk Interventions     No data to display

## 2023-11-17 NOTE — Evaluation (Signed)
 Physical Therapy Evaluation Patient Details Name: Cassandra Gill MRN: [id number] DOB: [birth year] Today's Date: 11/17/2023  History of Present Illness  Cassandra Gill is a 83 y.o. female with medical history significant of HTN, HLD, stress incontinence, blindness, who presents with fall and left hip pain. Hip X-ray: Acute comminuted inter trochanteric fractures of the proximal left  femur now s/p ORIF on 11/16/23, WBAT.   Clinical Impression  Pt admitted with above diagnosis. Pt currently with functional limitations due to the deficits listed below (see PT Problem List). Pt received upright in recliner agreeable to PT eval. Reports PTA being at ILF using her white cane due to her blindness. Facility provides meals.   To date, pt reliant on hand over hand cues due to being blind. CGA for STS to RW needing minA to progress RLE with LLE in stance phase due to poor tolerance from pain. Good stability however ambulating ~6' to bathroom. Pt independent with urination and pericare sitting and standing using elevated potty seat and railing. Pt ambulates additional 6' to recliner with same assist levels and hand over hand cuing. Pt fatigues due to UE's giving out on RW leading to seated rest break in recliner. Pt set up with all needs in reach. Discussed with pt and pt's family with her permission on typical POC and d/c recs. Pt will benefit from skilled PT services < 3 hours/day to address strength, pain, and functional mobility deficits in maximize return to PLOF.      If plan is discharge home, recommend the following: A lot of help with walking and/or transfers;A lot of help with bathing/dressing/bathroom;Assistance with cooking/housework;Assist for transportation;Help with stairs or ramp for entrance   Can travel by private vehicle   No    Equipment Recommendations Rolling walker (2 wheels)  Recommendations for Other Services       Functional Status Assessment Patient has had a recent decline in  their functional status and demonstrates the ability to make significant improvements in function in a reasonable and predictable amount of time.     Precautions / Restrictions Precautions Precautions: Fall Recall of Precautions/Restrictions: Intact Restrictions Weight Bearing Restrictions Per Provider Order: Yes LLE Weight Bearing Per Provider Order: Weight bearing as tolerated      Mobility  Bed Mobility               General bed mobility comments: NT. In recliner pre and post session. Patient Response: Cooperative  Transfers Overall transfer level: Needs assistance Equipment used: Rolling walker (2 wheels) Transfers: Sit to/from Stand Sit to Stand: Contact guard assist           General transfer comment: hand over hand set up due to being blind    Ambulation/Gait Ambulation/Gait assistance: Min assist Gait Distance (Feet): 12 Feet Assistive device: Rolling walker (2 wheels) Gait Pattern/deviations: Antalgic, Step-through pattern, Decreased step length - right, Decreased step length - left, Decreased stance time - left       General Gait Details: heavy BUE support needed on RW. Poor tolerance for stance phase on operated limb needing minA to progress RLE.  Stairs            Wheelchair Mobility     Tilt Bed Tilt Bed Patient Response: Cooperative  Modified Rankin (Stroke Patients Only)       Balance Overall balance assessment: Needs assistance Sitting-balance support: No upper extremity supported, Feet supported Sitting balance-Leahy Scale: Fair     Standing balance support: Bilateral upper extremity supported Standing balance-Leahy Scale: Poor  Standing balance comment: BUE support on RW needed.                             Pertinent Vitals/Pain Pain Assessment Pain Assessment: Faces Faces Pain Scale: Hurts even more Pain Location: LLE Pain Descriptors / Indicators: Grimacing, Aching, Operative site guarding Pain  Intervention(s): Limited activity within patient's tolerance, Monitored during session, Premedicated before session, Repositioned    Home Living Family/patient expects to be discharged to:: Assisted living                   Additional Comments: walking cane for the blind    Prior Function Prior Level of Function : Independent/Modified Independent               ADLs Comments: facility assists with meals/cleaning     Extremity/Trunk Assessment   Upper Extremity Assessment Upper Extremity Assessment: Overall WFL for tasks assessed    Lower Extremity Assessment Lower Extremity Assessment: Generalized weakness       Communication   Communication Communication: No apparent difficulties    Cognition Arousal: Alert Behavior During Therapy: WFL for tasks assessed/performed   PT - Cognitive impairments: No apparent impairments                         Following commands: Intact       Cueing Cueing Techniques: Verbal cues     General Comments      Exercises Other Exercises Other Exercises: Role of PT in acute setting, d/c recs, WB status   Assessment/Plan    PT Assessment Patient needs continued PT services  PT Problem List Decreased strength;Pain;Decreased range of motion;Decreased activity tolerance;Decreased balance;Decreased mobility       PT Treatment Interventions DME instruction;Balance training;Gait training;Neuromuscular re-education;Stair training;Functional mobility training;Patient/family education;Therapeutic activities;Therapeutic exercise    PT Goals (Current goals can be found in the Care Plan section)  Acute Rehab PT Goals Patient Stated Goal: improve pain and mobility PT Goal Formulation: With patient Time For Goal Achievement: 12/01/23 Potential to Achieve Goals: Good    Frequency 7X/week     Co-evaluation               AM-PAC PT 6 Clicks Mobility  Outcome Measure Help needed turning from your back to your  side while in a flat bed without using bedrails?: A Lot Help needed moving from lying on your back to sitting on the side of a flat bed without using bedrails?: A Lot Help needed moving to and from a bed to a chair (including a wheelchair)?: A Lot Help needed standing up from a chair using your arms (e.g., wheelchair or bedside chair)?: A Little Help needed to walk in hospital room?: A Little Help needed climbing 3-5 steps with a railing? : A Lot 6 Click Score: 14    End of Session Equipment Utilized During Treatment: Gait belt Activity Tolerance: Patient tolerated treatment well;Patient limited by pain Patient left: in chair;with call bell/phone within reach;with family/visitor present Nurse Communication: Mobility status PT Visit Diagnosis: Unsteadiness on feet (R26.81);Other abnormalities of gait and mobility (R26.89);Muscle weakness (generalized) (M62.81);Pain;Difficulty in walking, not elsewhere classified (R26.2) Pain - Right/Left: Left Pain - part of body: Hip    Time: 8496-8458 PT Time Calculation (min) (ACUTE ONLY): 38 min   Charges:   PT Evaluation $PT Eval Moderate Complexity: 1 Mod PT Treatments $Therapeutic Activity: 23-37 mins PT General Charges $$ ACUTE PT  VISIT: 1 Visit       [name]. Fairly IV, PT, DPT Physical Therapist- [place]  [facility] 11/17/2023, 4:26 PM

## 2023-11-17 NOTE — Progress Notes (Signed)
  Subjective: 1 Day Post-Op Procedure(s) (LRB): FIXATION, FRACTURE, INTERTROCHANTERIC, WITH INTRAMEDULLARY ROD (Left) Patient reports pain as mild in the left hip. CT chest just completed  Patient is well, and has had no acute complaints or problems PT and care management to assist with discharge planning. Negative for chest pain and shortness of breath Fever: None Gastrointestinal:Negative for nausea and vomiting  Objective: Vital signs in last 24 hours: Temp:  [97.4 F (36.3 C)-98.3 F (36.8 C)] 97.8 F (36.6 C) (08/29 0511) Pulse Rate:  [54-88] 54 (08/29 0511) Resp:  [10-21] 17 (08/29 0511) BP: (125-157)/(40-133) 126/40 (08/29 0511) SpO2:  [86 %-100 %] 100 % (08/29 0511) Weight:  [78.5 kg] 78.5 kg (08/28 1536)  Intake/Output from previous day:  Intake/Output Summary (Last 24 hours) at 11/17/2023 0753 Last data filed at 11/17/2023 0602 Gross per 24 hour  Intake 1898.75 ml  Output 725 ml  Net 1173.75 ml    Intake/Output this shift: No intake/output data recorded.  Labs: Recent Labs    11/15/23 2050 11/16/23 0507 11/17/23 0405  HGB 13.8 12.8 12.0   Recent Labs    11/16/23 0507 11/17/23 0405  WBC 9.5 9.0  RBC 4.31 4.01  HCT 38.6 36.7  PLT 150 129*   Recent Labs    11/16/23 0507 11/17/23 0405  NA 138 139  K 4.1 4.2  CL 107 108  CO2 25 25  BUN 19 16  CREATININE 0.78 0.55  GLUCOSE 191* 194*  CALCIUM 9.0 8.5*   Recent Labs    11/15/23 2231  INR 1.0     EXAM General - Patient is Alert, Appropriate, and Oriented Extremity - ABD soft Neurovascular intact Dorsiflexion/Plantar flexion intact Incision: dressing C/D/I No cellulitis present Dressing/Incision - clean, dry, no drainage noted to the left hip. Motor Function - intact, moving foot and toes well on exam.  Abdomen soft with intact bowel sounds.  Past Medical History:  Diagnosis Date   Blind    High cholesterol    HTN (hypertension)     Assessment/Plan: 1 Day Post-Op Procedure(s)  (LRB): FIXATION, FRACTURE, INTERTROCHANTERIC, WITH INTRAMEDULLARY ROD (Left) Principal Problem:   Closed left hip fracture (HCC) Active Problems:   Moderate obesity   HLD (hyperlipidemia)   HTN (hypertension)   Blind   Fall at home, initial encounter  Estimated body mass index is 33.79 kg/m as calculated from the following:   Height as of this encounter: 5' (1.524 m).   Weight as of this encounter: 78.5 kg. Advance diet Up with therapy D/C IV fluids when tolerating po intake.  Labs and vitals reviewed this AM. Up with therapy today. Continue to work on a BM. CT results of chest pending.  DVT Prophylaxis - Lovenox  and TED hose Weight-Bearing as tolerated to left leg  J. Gustavo Level, PA-C [facility] 11/17/2023, 7:53 AM

## 2023-11-17 NOTE — Care Management Important Message (Signed)
 Important Message  Patient Details  Name: Cassandra Gill MRN: [id number] Date of Birth: [birth year]   Important Message Given:  Yes - Medicare IM     [name] 11/17/2023, 4:06 PM

## 2023-11-17 NOTE — Evaluation (Signed)
 Occupational Therapy Evaluation Patient Details Name: Cassandra Gill MRN: [id number] DOB: 07/04/1940 Today's Date: 11/17/2023   History of Present Illness   Cassandra Gill is a 83 y.o. female with medical history significant of HTN, HLD, stress incontinence, blindness, who presents with fall and left hip pain. Hip X-ray: Acute comminuted inter trochanteric fractures of the proximal left  femur now s/p ORIF on 11/16/23, WABT.    Clinical Impressions Cassandra Gill was seen for OT evaluation this date. Prior to hospital admission, pt was IND using walking cane for the blind. Pt lives alone at [facility]. Pt currently requires MIN A + YRW for sit<>stand and steps bed>chair - step by step cueing to sequence. MAX A don B socks in sitting. Extensive education on OT role, POC and HEP. Tolerated seated and supine ankle pumps, quad sets, and glute squeezes. Pt would benefit from skilled OT to address noted impairments and functional limitations (see below for any additional details). Upon hospital discharge, recommend OT follow up <3 hours/day.    If plan is discharge home, recommend the following:   A lot of help with walking and/or transfers;A lot of help with bathing/dressing/bathroom     Functional Status Assessment   Patient has had a recent decline in their functional status and demonstrates the ability to make significant improvements in function in a reasonable and predictable amount of time.     Equipment Recommendations   BSC/3in1     Recommendations for Other Services         Precautions/Restrictions   Precautions Precautions: Fall Recall of Precautions/Restrictions: Intact Restrictions Weight Bearing Restrictions Per Provider Order: Yes LLE Weight Bearing Per Provider Order: Weight bearing as tolerated     Mobility Bed Mobility Overal bed mobility: Needs Assistance Bed Mobility: Supine to Sit     Supine to sit: Max assist          Transfers Overall  transfer level: Needs assistance Equipment used: Rolling walker (2 wheels) Transfers: Bed to chair/wheelchair/BSC, Sit to/from Stand Sit to Stand: Min assist     Step pivot transfers: Min assist            Balance Overall balance assessment: Needs assistance Sitting-balance support: No upper extremity supported, Feet supported Sitting balance-Leahy Scale: Fair     Standing balance support: Bilateral upper extremity supported Standing balance-Leahy Scale: Poor                             ADL either performed or assessed with clinical judgement   ADL Overall ADL's : Needs assistance/impaired                                       General ADL Comments: MIN A + YRW for simulated BSC t/f. MAX A don B socks in sitting      Pertinent Vitals/Pain Pain Assessment Pain Assessment: 0-10 Pain Score: 5  Pain Location: LLE Pain Descriptors / Indicators: Grimacing, Aching, Operative site guarding Pain Intervention(s): Limited activity within patient's tolerance, Repositioned     Extremity/Trunk Assessment Upper Extremity Assessment Upper Extremity Assessment: Overall WFL for tasks assessed   Lower Extremity Assessment Lower Extremity Assessment: Generalized weakness       Communication Communication Communication: No apparent difficulties   Cognition Arousal: Alert Behavior During Therapy: WFL for tasks assessed/performed Cognition: No apparent impairments  Following commands: Intact       Cueing  General Comments   Cueing Techniques: Verbal cues      Exercises     Shoulder Instructions      Home Living Family/patient expects to be discharged to:: Assisted living                                 Additional Comments: walking cane for the blind      Prior Functioning/Environment Prior Level of Function : Independent/Modified Independent               ADLs Comments:  facility assists with meals/cleaning    OT Problem List: Decreased strength;Decreased range of motion;Decreased activity tolerance;Impaired balance (sitting and/or standing);Decreased safety awareness   OT Treatment/Interventions: Self-care/ADL training;Therapeutic exercise;Energy conservation;DME and/or AE instruction;Therapeutic activities;Patient/family education      OT Goals(Current goals can be found in the care plan section)   Acute Rehab OT Goals Patient Stated Goal: to go home OT Goal Formulation: With patient Time For Goal Achievement: 12/01/23 Potential to Achieve Goals: Good ADL Goals Pt Will Perform Grooming: with modified independence;standing Pt Will Perform Lower Body Dressing: with modified independence;sit to/from stand Pt Will Transfer to Toilet: with modified independence;ambulating;regular height toilet   OT Frequency:  Min 3X/week    Co-evaluation              AM-PAC OT 6 Clicks Daily Activity     Outcome Measure Help from another person eating meals?: None Help from another person taking care of personal grooming?: A Little Help from another person toileting, which includes using toliet, bedpan, or urinal?: A Lot Help from another person bathing (including washing, rinsing, drying)?: A Lot Help from another person to put on and taking off regular upper body clothing?: A Little Help from another person to put on and taking off regular lower body clothing?: A Lot 6 Click Score: 16   End of Session Equipment Utilized During Treatment: Rolling walker (2 wheels);Gait belt  Activity Tolerance: Patient tolerated treatment well Patient left: in chair;with call bell/phone within reach;with chair alarm set  OT Visit Diagnosis: Other abnormalities of gait and mobility (R26.89);Muscle weakness (generalized) (M62.81)                Time: 8744-8652 OT Time Calculation (min): 52 min Charges:  OT General Charges $OT Visit: 1 Visit OT Evaluation $OT Eval  Moderate Complexity: 1 Mod OT Treatments $Self Care/Home Management : 38-52 mins  Elston Slot, M.S. OTR/L  11/17/23, 3:33 PM  ascom [phone]

## 2023-11-17 NOTE — Plan of Care (Signed)

## 2023-11-18 DIAGNOSIS — S72002A Fracture of unspecified part of neck of left femur, initial encounter for closed fracture: Secondary | ICD-10-CM | POA: Diagnosis not present

## 2023-11-18 LAB — CBC
HCT: 32.2 % — ABNORMAL LOW (ref 36.0–46.0)
Hemoglobin: 10.6 g/dL — ABNORMAL LOW (ref 12.0–15.0)
MCH: 29.9 pg (ref 26.0–34.0)
MCHC: 32.9 g/dL (ref 30.0–36.0)
MCV: 91 fL (ref 80.0–100.0)
Platelets: 135 K/uL — ABNORMAL LOW (ref 150–400)
RBC: 3.54 MIL/uL — ABNORMAL LOW (ref 3.87–5.11)
RDW: 13.3 % (ref 11.5–15.5)
WBC: 10.2 K/uL (ref 4.0–10.5)
nRBC: 0 % (ref 0.0–0.2)

## 2023-11-18 LAB — BASIC METABOLIC PANEL WITH GFR
Anion gap: 7 (ref 5–15)
BUN: 29 mg/dL — ABNORMAL HIGH (ref 8–23)
CO2: 24 mmol/L (ref 22–32)
Calcium: 8.6 mg/dL — ABNORMAL LOW (ref 8.9–10.3)
Chloride: 107 mmol/L (ref 98–111)
Creatinine, Ser: 0.52 mg/dL (ref 0.44–1.00)
GFR, Estimated: >60 mL/min (ref >60)
Glucose, Bld: 175 mg/dL — ABNORMAL HIGH (ref 70–99)
Potassium: 4.1 mmol/L (ref 3.5–5.1)
Sodium: 138 mmol/L (ref 135–145)

## 2023-11-18 LAB — GLUCOSE, CAPILLARY
Glucose-Capillary: 174 mg/dL — ABNORMAL HIGH (ref 70–99)
Glucose-Capillary: 197 mg/dL — ABNORMAL HIGH (ref 70–99)
Glucose-Capillary: 196 mg/dL — ABNORMAL HIGH (ref 70–99)

## 2023-11-18 LAB — HEMOGLOBIN A1C
Hgb A1c MFr Bld: 6.8 % — ABNORMAL HIGH (ref 4.8–5.6)
Mean Plasma Glucose: 148 mg/dL

## 2023-11-18 MED ORDER — INSULIN ASPART 100 UNIT/ML IJ SOLN
0.0000 [IU] | Freq: Three times a day (TID) | INTRAMUSCULAR | Status: DC
  Administered 2023-11-18 (×2): 3 [IU] via SUBCUTANEOUS
  Administered 2023-11-19: 2 [IU] via SUBCUTANEOUS
  Administered 2023-11-19 (×2): 3 [IU] via SUBCUTANEOUS
  Administered 2023-11-20: 2 [IU] via SUBCUTANEOUS
  Administered 2023-11-20 – 2023-11-21 (×4): 3 [IU] via SUBCUTANEOUS
  Filled 2023-11-18 (×10): qty 1

## 2023-11-18 MED ORDER — OXYCODONE HCL 5 MG PO TABS: 5.0000 mg | ORAL_TABLET | Freq: Four times a day (QID) | ORAL | 0 refills | Status: AC | PRN

## 2023-11-18 MED ORDER — ENOXAPARIN SODIUM 40 MG/0.4ML IJ SOSY: 40.0000 mg | PREFILLED_SYRINGE | INTRAMUSCULAR | 0 refills | Status: DC

## 2023-11-18 NOTE — Progress Notes (Signed)
  Subjective: 2 Days Post-Op Procedure(s) (LRB): FIXATION, FRACTURE, INTERTROCHANTERIC, WITH INTRAMEDULLARY ROD (Left) Patient reports pain as mild in the left hip. Moderate pain when working with PT. Patient is well, and has had no acute complaints or problems PT and care management to assist with discharge planning. Patient lives at the [facility] [place], plan for discharge to SNF at the [facility]. Negative for chest pain and shortness of breath Fever: None Gastrointestinal:Negative for nausea and vomiting Reports she is passing gas, no BM yet.  Objective: Vital signs in last 24 hours: Temp:  [97.6 F (36.4 C)-100 F (37.8 C)] 97.8 F (36.6 C) (08/30 0323) Pulse Rate:  [65-80] 69 (08/30 0323) Resp:  [15-18] 15 (08/30 0323) BP: (105-133)/(53-71) 126/54 (08/30 0323) SpO2:  [96 %-100 %] 96 % (08/30 0323)  Intake/Output from previous day:  Intake/Output Summary (Last 24 hours) at 11/18/2023 0753 Last data filed at 11/18/2023 0630 Gross per 24 hour  Intake 956 ml  Output 1000 ml  Net -44 ml    Intake/Output this shift: No intake/output data recorded.  Labs: Recent Labs    11/15/23 2050 11/16/23 0507 11/17/23 0405 11/18/23 0604  HGB 13.8 12.8 12.0 10.6*   Recent Labs    11/17/23 0405 11/18/23 0604  WBC 9.0 10.2  RBC 4.01 3.54*  HCT 36.7 32.2*  PLT 129* 135*   Recent Labs    11/17/23 0405 11/18/23 0604  NA 139 138  K 4.2 4.1  CL 108 107  CO2 25 24  BUN 16 29*  CREATININE 0.55 0.52  GLUCOSE 194* 175*  CALCIUM 8.5* 8.6*   Recent Labs    11/15/23 2231  INR 1.0     EXAM General - Patient is Alert, Appropriate, and Oriented Extremity - ABD soft Neurovascular intact Dorsiflexion/Plantar flexion intact Incision: dressing C/D/I No cellulitis present Dressing/Incision - clean, dry, no drainage noted to the left hip. Motor Function - intact, moving foot and toes well on exam.  Abdomen soft with intact bowel sounds.  Past Medical History:   Diagnosis Date   Blind    High cholesterol    HTN (hypertension)     Assessment/Plan: 2 Days Post-Op Procedure(s) (LRB): FIXATION, FRACTURE, INTERTROCHANTERIC, WITH INTRAMEDULLARY ROD (Left) Principal Problem:   Closed left hip fracture (HCC) Active Problems:   Moderate obesity   HLD (hyperlipidemia)   HTN (hypertension)   Blind   Fall at home, initial encounter  Estimated body mass index is 33.79 kg/m as calculated from the following:   Height as of this encounter: 5' (1.524 m).   Weight as of this encounter: 78.5 kg. Advance diet Up with therapy D/C IV fluids when tolerating po intake.  Labs and vitals reviewed this AM. WBC 10.2, Hg 10.6 Up with therapy today. Continue to work on a BM.  Patient is passing gas. Can bathe, honeycomb dressings intact.  Following discharge, continue Lovenox  40mg  daily for 14 days. Follow-up with [facility] in 10-14 days for staple removal and x-rays of the left femur.  DVT Prophylaxis - Lovenox  and TED hose Weight-Bearing as tolerated to left leg  J. Gustavo Level, PA-C [facility] 11/18/2023, 7:53 AM

## 2023-11-18 NOTE — Progress Notes (Signed)
 Progress Note   Patient: Cassandra Gill FMW:[id number] DOB: [birth year] DOA: 11/15/2023     3 DOS: the patient was seen and examined on 11/18/2023   Brief hospital course:  Cassandra Gill is a 83 y.o. female with medical history significant of HTN, HLD, stress incontinence, blindness, who presents with fall and left hip pain.   Patient is a deeply blind, lives alone at home.  She states that she was walking and turned and lost her balance and fell.  She developed left hip pain, which is constant, sharp, moderate to severe, nonradiating, aggravated by movement.  She is unable to get up and bear weight on the left leg.  No chest pain, cough, SOB.  No nausea, vomiting, diarrhea or abdominal pain.  No symptoms of UTI.  Denies alcohol drinking.  Patient strongly denies any head or neck injury, she refused CT scan of head and neck.      Assessment and Plan:  Principal Problem:   Closed left hip fracture [facility]) Active Problems:   Fall at home, initial encounter   HTN (hypertension)   HLD (hyperlipidemia)   Blind   Moderate obesity   # Left intertrochanteric hip fracture S/p surgical repair on 8/28 with dr. Edie Noted to have a drop in her H&H to 10.6 from 12 Appreciate PT input Pain control, bowel regimen Awaiting discharge to SNF Lovenox  for dvt ppx per ortho, duration TBD WBAT left leg    # Thrombocytopenia Stable Monitor closely    # HTN Patient is normotensive    # Hyperglycemia Per patient, borderline diabetic Hemoglobin A1c is 6.8 which according to patient's daughter is better than it has been Maintain consistent carbohydrate diet Sliding scale insulin  Discussed with patient and her daughter the need to start oral hypoglycemic agents like metformin, patient wants to think about it for now    # OAB - home oxybutynin     # Pulmonary infiltrate vs mass On cxr. Asymptomatic.  CT scan of the chest showed mild bilateral subsegmental atelectasis and trace bilateral  pleural effusions. No lung mass or adenopathy. Multinodular goiter with dominant 2.9 cm hypodense left thyroid  nodule;    # Goiter - has outpt u/s scheduled for next week    # Blind Requires assistance with some ADLs   # Obesity Class I BMI 33.79 Complicates overall prognosis and care       Subjective: No new complaints.  Sitting up in a recliner.  Family at the bedside  Physical Exam: Vitals:   11/17/23 1637 11/17/23 2130 11/18/23 0323 11/18/23 0756  BP: (!) 128/53 105/61 (!) 126/54 (!) 111/52  Pulse: 71 80 69 62  Resp: 18 17 15 19   Temp: 98.1 F (36.7 C) 100 F (37.8 C) 97.8 F (36.6 C) 97.8 F (36.6 C)  TempSrc: Oral Oral  Oral  SpO2: 100% 100% 96% 96%  Weight:      Height:       General exam: Appears calm and comfortable  Respiratory system: Clear to auscultation. Respiratory effort normal. Cardiovascular system: S1 & S2 heard, RRR. Soft systolic murmur Gastrointestinal system: Abdomen is nondistended, soft and nontender. No organomegaly or masses felt.  Central nervous system: Alert and oriented. No focal neurological deficits. Extremities: warm, distal sensation intact Skin: c/d/I bandages left hip Psychiatry: Judgement and insight appear normal. Mood & affect appropriate.        Data Reviewed: Hemoglobin 10.6, hemoglobin A1c 6.8, platelet count 135 Labs reviewed  Family Communication: Plan of care was discussed with patient  and her family at the bedside.  They verbalized understanding and agreed with the plan  Disposition: Status is: Inpatient Remains inpatient appropriate because: Awaiting discharge to SNF  Planned Discharge Destination: Skilled nursing facility    Time spent: 40  minutes  Author: Aimee Somerset, MD 11/18/2023 1:30 PM  For on call review [email or web address].

## 2023-11-18 NOTE — Progress Notes (Signed)
 Physical Therapy Treatment Patient Details Name: Cassandra Gill MRN: [id number] DOB: February 09, 1941 Today's Date: 11/18/2023   History of Present Illness Cassandra Gill is a 83 y.o. female with medical history significant of HTN, HLD, stress incontinence, blindness, who presents with fall and left hip pain. Hip X-ray: Acute comminuted inter trochanteric fractures of the proximal left  femur now s/p ORIF on 11/16/23, WBAT.  During tranfers and gait, pt required heavy BUE support needed on RW. Poor tolerance for stance phase on operated limb. Pt had a lot of difficulty stepping forward, easier for her to step backwards. Distance limited 2/2 LLE pain/ anxiety.  Given enough time and verbal/manual cues for safe sequencing, pt is CGA -Min A for transfers and stepping with RW.  Continued PT will assist pt towards greater dynamic standing balance, LE strengthening, and activity tolerance to increase safety and independence and decrease burden of care with functional mobility.    PT Comments     If plan is discharge home, recommend the following: A lot of help with walking and/or transfers;A lot of help with bathing/dressing/bathroom;Assistance with cooking/housework;Assist for transportation;Help with stairs or ramp for entrance   Can travel by private vehicle     No  Equipment Recommendations  Rolling walker (2 wheels)    Recommendations for Other Services       Precautions / Restrictions Precautions Precautions: Fall Recall of Precautions/Restrictions: Intact Restrictions Weight Bearing Restrictions Per Provider Order: Yes LLE Weight Bearing Per Provider Order: Weight bearing as tolerated     Mobility  Bed Mobility               General bed mobility comments: NT- Pt seated up at EOB    Transfers Overall transfer level: Needs assistance Equipment used: Rolling walker (2 wheels) Transfers: Sit to/from Stand, Bed to chair/wheelchair/BSC Sit to Stand: Contact guard assist   Step  pivot transfers: From elevated surface, Contact guard assist       General transfer comment: cues for hand placement, Mod cues to scoot forward to EOB to prepare for sit<>stands.    Ambulation/Gait Ambulation/Gait assistance: Min assist Gait Distance (Feet): 2 Feet (3steps forward 4 steps backward.)   Gait Pattern/deviations: Antalgic, Step-through pattern, Decreased step length - right, Decreased step length - left, Decreased stance time - left       General Gait Details: heavy BUE support needed on RW. Poor tolerance for stance phase on operated limb.  Pt had a lot of difficulty stepping forward, easier for her to step backwards.  distance limited 2/2 LLE pain/ anxiety.   Stairs             Wheelchair Mobility     Tilt Bed    Modified Rankin (Stroke Patients Only)       Balance Overall balance assessment: Needs assistance Sitting-balance support: No upper extremity supported, Feet supported Sitting balance-Leahy Scale: Fair Sitting balance - Comments: difficulty weight shifting to scoot forward   Standing balance support: Bilateral upper extremity supported Standing balance-Leahy Scale: Poor Standing balance comment: BUE support on RW needed.                            Communication Communication Communication: No apparent difficulties  Cognition Arousal: Alert Behavior During Therapy: WFL for tasks assessed/performed   PT - Cognitive impairments: No apparent impairments  Following commands: Intact      Cueing Cueing Techniques: Verbal cues  Exercises      General Comments        Pertinent Vitals/Pain Pain Assessment Pain Score: 8  Pain Location: LLE Pain Descriptors / Indicators: Grimacing, Aching, Operative site guarding Pain Intervention(s): Limited activity within patient's tolerance, Monitored during session, Premedicated before session    Home Living                           Prior Function            PT Goals (current goals can now be found in the care plan section) Acute Rehab PT Goals Patient Stated Goal: improve pain and mobility PT Goal Formulation: With patient Time For Goal Achievement: 12/01/23 Potential to Achieve Goals: Good Progress towards PT goals: Progressing toward goals    Frequency    7X/week      PT Plan      Co-evaluation              AM-PAC PT 6 Clicks Mobility   Outcome Measure  Help needed turning from your back to your side while in a flat bed without using bedrails?: A Lot Help needed moving from lying on your back to sitting on the side of a flat bed without using bedrails?: A Lot Help needed moving to and from a bed to a chair (including a wheelchair)?: A Lot Help needed standing up from a chair using your arms (e.g., wheelchair or bedside chair)?: A Little Help needed to walk in hospital room?: A Lot Help needed climbing 3-5 steps with a railing? : A Lot 6 Click Score: 13    End of Session Equipment Utilized During Treatment: Gait belt Activity Tolerance: Patient limited by pain Patient left: in chair;with call bell/phone within reach;with family/visitor present Nurse Communication: Mobility status PT Visit Diagnosis: Unsteadiness on feet (R26.81);Other abnormalities of gait and mobility (R26.89);Muscle weakness (generalized) (M62.81);Pain;Difficulty in walking, not elsewhere classified (R26.2) Pain - Right/Left: Left Pain - part of body: Hip     Time: 9149-9084 PT Time Calculation (min) (ACUTE ONLY): 25 min  Charges:    $Therapeutic Activity: 23-37 mins PT General Charges $$ ACUTE PT VISIT: 1 Visit                     Harland Irving, PTA  11/18/23, 9:34 AM

## 2023-11-18 NOTE — Discharge Instructions (Signed)

## 2023-11-18 NOTE — Plan of Care (Signed)

## 2023-11-19 ENCOUNTER — Inpatient Hospital Stay

## 2023-11-19 DIAGNOSIS — S72002A Fracture of unspecified part of neck of left femur, initial encounter for closed fracture: Secondary | ICD-10-CM | POA: Diagnosis not present

## 2023-11-19 LAB — BASIC METABOLIC PANEL WITH GFR
Anion gap: 4 — ABNORMAL LOW (ref 5–15)
BUN: 22 mg/dL (ref 8–23)
CO2: 29 mmol/L (ref 22–32)
Calcium: 8.7 mg/dL — ABNORMAL LOW (ref 8.9–10.3)
Chloride: 108 mmol/L (ref 98–111)
Creatinine, Ser: 0.63 mg/dL (ref 0.44–1.00)
GFR, Estimated: >60 mL/min (ref >60)
Glucose, Bld: 145 mg/dL — ABNORMAL HIGH (ref 70–99)
Potassium: 4.3 mmol/L (ref 3.5–5.1)
Sodium: 141 mmol/L (ref 135–145)

## 2023-11-19 LAB — CBC
HCT: 33 % — ABNORMAL LOW (ref 36.0–46.0)
Hemoglobin: 10.3 g/dL — ABNORMAL LOW (ref 12.0–15.0)
MCH: 29.3 pg (ref 26.0–34.0)
MCHC: 31.2 g/dL (ref 30.0–36.0)
MCV: 94 fL (ref 80.0–100.0)
Platelets: 150 K/uL (ref 150–400)
RBC: 3.51 MIL/uL — ABNORMAL LOW (ref 3.87–5.11)
RDW: 13.6 % (ref 11.5–15.5)
WBC: 7.1 K/uL (ref 4.0–10.5)
nRBC: 0 % (ref 0.0–0.2)

## 2023-11-19 LAB — GLUCOSE, CAPILLARY
Glucose-Capillary: 128 mg/dL — ABNORMAL HIGH (ref 70–99)
Glucose-Capillary: 172 mg/dL — ABNORMAL HIGH (ref 70–99)
Glucose-Capillary: 191 mg/dL — ABNORMAL HIGH (ref 70–99)
Glucose-Capillary: 220 mg/dL — ABNORMAL HIGH (ref 70–99)

## 2023-11-19 NOTE — Progress Notes (Signed)
 Progress Note   Patient: Cassandra Gill FMW:[id number] DOB: Oct 23, 1940 DOA: 11/15/2023     4 DOS: the patient was seen and examined on 11/19/2023   Brief hospital course:  Cassandra Gill is a 83 y.o. female with medical history significant of HTN, HLD, stress incontinence, blindness, who presents with fall and left hip pain.   Patient is blind, lives alone at home.  She states that she was walking and turned and lost her balance and fell.  She developed left hip pain, which is constant, sharp, moderate to severe, nonradiating, aggravated by movement.  She is unable to get up and bear weight on the left leg.  No chest pain, cough, SOB.  No nausea, vomiting, diarrhea or abdominal pain.  No symptoms of UTI.  Denies alcohol drinking.  Patient strongly denies any head or neck injury, she refused CT scan of head and neck.     Assessment and Plan:  Principal Problem:   Closed left hip fracture [facility]) Active Problems:   Fall at home, initial encounter   HTN (hypertension)   HLD (hyperlipidemia)   Blind   Moderate obesity   # Left intertrochanteric hip fracture S/p surgical repair on 8/28 with dr. Edie Noted to have a drop in her H&H  which is stable Appreciate PT input Pain control, bowel regimen Awaiting discharge to SNF Lovenox  for dvt ppx per ortho for 14 days WBAT left leg     # Thrombocytopenia Stable Improving Monitor closely     # HTN Patient is normotensive     # Hyperglycemia Per patient, borderline diabetic Hemoglobin A1c is 6.8 which according to patient's daughter is better than it has been Maintain consistent carbohydrate diet Sliding scale insulin  Discussed with patient and her daughter the need to start oral hypoglycemic agents like metformin, patient wants to think about it for now     # OAB - home oxybutynin      # Pulmonary infiltrate vs mass On cxr. Asymptomatic.  CT scan of the chest showed mild bilateral subsegmental atelectasis and trace bilateral  pleural effusions. No lung mass or adenopathy. Multinodular goiter with dominant 2.9 cm hypodense left thyroid  nodule;     # Goiter - has outpt u/s scheduled for next week     # Blind Requires assistance with some ADLs   # Obesity Class I BMI 33.79 Complicates overall prognosis and care Lifestyle modification and exercise has been discussed with patient in detail       Subjective: No new complaints.  Sitting up in a recliner  Physical Exam: Vitals:   11/18/23 1615 11/18/23 1952 11/19/23 0402 11/19/23 0716  BP: (!) 140/40 (!) 143/43 (!) 118/39 (!) 108/51  Pulse: 72 76 72 73  Resp: 17 16 18 17   Temp: 98 F (36.7 C) 97.7 F (36.5 C) 98.6 F (37 C) 98.7 F (37.1 C)  TempSrc:  Oral Oral Oral  SpO2: 98% 98% 94% 96%  Weight:      Height:       General exam: Appears calm and comfortable  Respiratory system: Clear to auscultation. Respiratory effort normal. Cardiovascular system: S1 & S2 heard, RRR. Soft systolic murmur Gastrointestinal system: Abdomen is nondistended, soft and nontender. No organomegaly or masses felt.  Central nervous system: Alert and oriented. No focal neurological deficits. Extremities: warm, distal sensation intact Skin: c/d/I bandages left hip Psychiatry: Judgement and insight appear normal. Mood & affect appropriate.        Data Reviewed: Hemoglobin 10.3, glucose 145 Labs reviewed  Family Communication: Plan of care discussed with patient in detail.  She verbalizes understanding and agrees with plan  Disposition: Status is: Inpatient Remains inpatient appropriate because: Awaiting discharge to SNF  Planned Discharge Destination: Skilled nursing facility    Time spent: 30 minutes  Author: Aimee Somerset, MD 11/19/2023 10:53 AM  For on call review [email or web address].

## 2023-11-19 NOTE — TOC Progression Note (Signed)
 Transition of Care [facility]) - Progression Note    Patient Details  Name: Cassandra Gill MRN: [id number] Date of Birth: 16-Aug-1940  [facility] CM/SW Contact  Marinda Cooks, RN Phone Number: 11/19/2023, 12:25 PM  Clinical Narrative:    This CM attempted to reach admission liaison at [facility] [address] at [phone] and was sent to VM , detailed message left with this CM contact info. This CM spoke with pt's daughter and was informed she was told pt could not dc to facility until Tues of this upcoming wk due to facility staffing. TOC will cont to follow dc planning / care coordination and update as applicable,                      Expected Discharge Plan and Services                                               Social Drivers of Health (SDOH) Interventions SDOH Screenings   Food Insecurity: No Food Insecurity (11/16/2023)  Housing: Low Risk  (11/16/2023)  Transportation Needs: No Transportation Needs (11/15/2023)  Utilities: Not At Risk (11/15/2023)  Depression (PHQ2-9): Low Risk  (07/13/2021)  Social Connections: Moderately Integrated (11/16/2023)  Tobacco Use: Low Risk  (07/26/2023)    Readmission Risk Interventions     No data to display

## 2023-11-19 NOTE — Plan of Care (Signed)

## 2023-11-19 NOTE — Plan of Care (Signed)

## 2023-11-19 NOTE — Progress Notes (Signed)
  Subjective: 3 Days Post-Op Procedure(s) (LRB): FIXATION, FRACTURE, INTERTROCHANTERIC, WITH INTRAMEDULLARY ROD (Left) Patient reports pain as mild in the left hip. Moderate pain when working with PT. Patient is well, and has had no acute complaints or problems Is experiencing some worsening calf pain in the left leg. PT and care management to assist with discharge planning. Patient lives at the [facility] [place], plan for discharge to SNF at the [facility]. Negative for chest pain and shortness of breath Fever: None Gastrointestinal:Negative for nausea and vomiting She has had a BM.  Objective: Vital signs in last 24 hours: Temp:  [97.7 F (36.5 C)-98.7 F (37.1 C)] 98.7 F (37.1 C) (08/31 0716) Pulse Rate:  [72-76] 73 (08/31 0716) Resp:  [16-18] 17 (08/31 0716) BP: (108-143)/(39-51) 108/51 (08/31 0716) SpO2:  [94 %-98 %] 96 % (08/31 0716)  Intake/Output from previous day:  Intake/Output Summary (Last 24 hours) at 11/19/2023 1212 Last data filed at 11/19/2023 0900 Gross per 24 hour  Intake 480 ml  Output --  Net 480 ml    Intake/Output this shift: Total I/O In: 240 [P.O.:240] Out: -   Labs: Recent Labs    11/17/23 0405 11/18/23 0604 11/19/23 0604  HGB 12.0 10.6* 10.3*   Recent Labs    11/18/23 0604 11/19/23 0604  WBC 10.2 7.1  RBC 3.54* 3.51*  HCT 32.2* 33.0*  PLT 135* 150   Recent Labs    11/18/23 0604 11/19/23 0604  NA 138 141  K 4.1 4.3  CL 107 108  CO2 24 29  BUN 29* 22  CREATININE 0.52 0.63  GLUCOSE 175* 145*  CALCIUM 8.6* 8.7*   No results for input(s): LABPT, INR in the last 72 hours.    EXAM General - Patient is Alert, Appropriate, and Oriented Extremity - ABD soft Neurovascular intact Dorsiflexion/Plantar flexion intact Incision: dressing C/D/I No cellulitis present Positive homans test to the left leg. Dressing/Incision - clean, dry, no drainage noted to the left hip. Motor Function - intact, moving foot and toes well on  exam.  Abdomen soft with intact bowel sounds.  Past Medical History:  Diagnosis Date   Blind    High cholesterol    HTN (hypertension)     Assessment/Plan: 3 Days Post-Op Procedure(s) (LRB): FIXATION, FRACTURE, INTERTROCHANTERIC, WITH INTRAMEDULLARY ROD (Left) Principal Problem:   Closed left hip fracture (HCC) Active Problems:   Moderate obesity   HLD (hyperlipidemia)   HTN (hypertension)   Blind   Fall at home, initial encounter  Estimated body mass index is 33.79 kg/m as calculated from the following:   Height as of this encounter: 5' (1.524 m).   Weight as of this encounter: 78.5 kg. Advance diet Up with therapy D/C IV fluids when tolerating po intake.  Labs and vitals reviewed this AM. WBC 7.1, Hg 10.3 Up with therapy today. Patient has had a BM. Worsened calf pain today with positive homans test. Will obtain a US  of the left leg to r/o DVT.  Following discharge, continue Lovenox  40mg  daily for 14 days. Follow-up with [facility] in 10-14 days for staple removal and x-rays of the left femur.  DVT Prophylaxis - Lovenox  and TED hose Weight-Bearing as tolerated to left leg  J. Gustavo Level, PA-C [facility] 11/19/2023, 12:12 PM

## 2023-11-19 NOTE — Progress Notes (Signed)
 Physical Therapy Treatment Patient Details Name: Cassandra Gill MRN: [id number] DOB: 14-Aug-1940 Today's Date: 11/19/2023   History of Present Illness Cassandra Gill is a 83 y.o. female with medical history significant of HTN, HLD, stress incontinence, blindness, who presents with fall and left hip pain. Hip X-ray: Acute comminuted inter trochanteric fractures of the proximal left  femur now s/p ORIF on 11/16/23, WBAT.    PT Comments  Pt in bed, needing to have BM.  She needs mod a and inc time to get to EOB.  Steady once sitting.  Attempted transfer to R but she has difficulty stepping.  BSC is moved to her left and she is able to transfer (towards affected side) to [facility] and to recliner with min a and mod cues.  She does voice several times general fear.  Pt is blind so clear instructions given.  No BM but does void in [facility].  She remains up with needs met.    Of note, pt stated it took x 3 to help her back to bed yesterday due to stiffness from sitting.  Given difficulty stepping, inc time needed and general fear of falling she would be a good candidate for [facility] lift with nursing staff until mobility and comfort improves..  Communicated with staff.   If plan is discharge home, recommend the following: A lot of help with walking and/or transfers;A lot of help with bathing/dressing/bathroom;Assistance with cooking/housework;Assist for transportation;Help with stairs or ramp for entrance   Can travel by private vehicle        Equipment Recommendations  Rolling walker (2 wheels)    Recommendations for Other Services       Precautions / Restrictions Precautions Precautions: Fall Recall of Precautions/Restrictions: Intact Precaution/Restrictions Comments: blind and a bit fearful but does well with encouragement Restrictions Weight Bearing Restrictions Per Provider Order: Yes LLE Weight Bearing Per Provider Order: Weight bearing as tolerated     Mobility  Bed Mobility Overal bed mobility:  Needs Assistance Bed Mobility: Supine to Sit     Supine to sit: Mod assist, Used rails, HOB elevated       Patient Response: Cooperative  Transfers Overall transfer level: Needs assistance Equipment used: Rolling walker (2 wheels) Transfers: Sit to/from Stand, Bed to chair/wheelchair/BSC Sit to Stand: Min assist                Ambulation/Gait Ambulation/Gait assistance: Min Chemical engineer (Feet): 2 Feet Assistive device: Rolling walker (2 wheels) Gait Pattern/deviations: Antalgic, Step-through pattern, Decreased step length - right, Decreased step length - left, Decreased stance time - left Gait velocity: dec     General Gait Details: very slow, poor quality steps with heavy lean on RW for support.   Stairs             Wheelchair Mobility     Tilt Bed Tilt Bed Patient Response: Cooperative  Modified Rankin (Stroke Patients Only)       Balance Overall balance assessment: Needs assistance Sitting-balance support: No upper extremity supported, Feet supported Sitting balance-Leahy Scale: Fair     Standing balance support: Bilateral upper extremity supported Standing balance-Leahy Scale: Poor Standing balance comment: BUE support on RW needed.                            Communication Communication Communication: No apparent difficulties  Cognition Arousal: Alert Behavior During Therapy: WFL for tasks assessed/performed   PT - Cognitive impairments: No apparent impairments  Following commands: Intact      Cueing Cueing Techniques: Verbal cues  Exercises Other Exercises Other Exercises: to [facility] to void    General Comments        Pertinent Vitals/Pain Pain Assessment Pain Assessment: Faces Faces Pain Scale: Hurts little more Pain Location: LLE Pain Descriptors / Indicators: Grimacing, Aching, Operative site guarding Pain Intervention(s): Limited activity within patient's tolerance,  Monitored during session, Repositioned    Home Living                          Prior Function            PT Goals (current goals can now be found in the care plan section) Progress towards PT goals: Progressing toward goals    Frequency    7X/week      PT Plan      Co-evaluation              AM-PAC PT 6 Clicks Mobility   Outcome Measure  Help needed turning from your back to your side while in a flat bed without using bedrails?: A Lot Help needed moving from lying on your back to sitting on the side of a flat bed without using bedrails?: A Lot Help needed moving to and from a bed to a chair (including a wheelchair)?: A Lot Help needed standing up from a chair using your arms (e.g., wheelchair or bedside chair)?: A Little Help needed to walk in hospital room?: A Lot Help needed climbing 3-5 steps with a railing? : Total 6 Click Score: 12    End of Session Equipment Utilized During Treatment: Gait belt Activity Tolerance: Patient tolerated treatment well;Patient limited by fatigue Patient left: in chair;with call bell/phone within reach;with chair alarm set Nurse Communication: Mobility status PT Visit Diagnosis: Unsteadiness on feet (R26.81);Other abnormalities of gait and mobility (R26.89);Muscle weakness (generalized) (M62.81);Pain;Difficulty in walking, not elsewhere classified (R26.2) Pain - Right/Left: Left Pain - part of body: Hip     Time: 9180-9154 PT Time Calculation (min) (ACUTE ONLY): 26 min  Charges:    $Therapeutic Activity: 23-37 mins PT General Charges $$ ACUTE PT VISIT: 1 Visit                    Lauraine Gills, PTA 11/19/23, 9:26 AM

## 2023-11-19 NOTE — NC FL2 (Signed)
 [place]  MEDICAID [id number] LEVEL OF CARE FORM     IDENTIFICATION  Patient Name: Cassandra Gill Birthdate: 1940/05/06 Sex: female Admission Date (Current Location): 11/15/2023  [facility] and [place] Number:  Chiropodist and Address:  [facility], [address] 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   Stress incontinence, female 03/10/2018   Menopause 03/10/2018   Moderate obesity 03/10/2018    Orientation RESPIRATION BLADDER Height & Weight     Self, Time, Situation, Place  Normal Continent Weight: 78.5 kg Height:  5' (152.4 cm)  BEHAVIORAL SYMPTOMS/MOOD NEUROLOGICAL BOWEL NUTRITION STATUS     (N/A) Continent Diet  AMBULATORY STATUS COMMUNICATION OF NEEDS Skin   Extensive Assist Verbally Surgical wounds (Left hip covered by three honeycomb dressings.)                       Personal Care Assistance Level of Assistance  Bathing, Dressing Bathing Assistance: Limited assistance   Dressing Assistance: Limited assistance     Functional Limitations Info  Sight (Pt blind in both eyes) Sight Info: Impaired        SPECIAL CARE FACTORS FREQUENCY  PT (By licensed PT), OT (By licensed OT)     PT Frequency: 5x a wk OT Frequency: 5x a wk            Contractures Contractures Info: Not present    Additional Factors Info  Code Status Code Status Info: Full Code              Current Medications (11/19/2023):  This is the current hospital active medication list Current Facility-Administered Medications  Medication Dose Route Frequency Provider Last Rate Last Admin   acetaminophen  (TYLENOL ) tablet 650 mg  650 mg Oral Q6H PRN Poggi, John J, MD   650 mg at 11/19/23 1134   enoxaparin  (LOVENOX ) injection 40 mg  40 mg Subcutaneous Q24H Kip Lynwood Double, PA-C   40 mg at 11/19/23 9046   feeding supplement (ENSURE PLUS HIGH PROTEIN) liquid 237 mL  237 mL Oral BID BM Poggi, John J, MD   237 mL at 11/18/23 1515   hydrALAZINE  (APRESOLINE ) injection 5 mg  5 mg Intravenous Q2H PRN Poggi, John J, MD       insulin  aspart (novoLOG ) injection 0-15 Units  0-15 Units Subcutaneous TID WC Agbata, Tochukwu, MD   2 Units at 11/19/23 1259   methocarbamol  (ROBAXIN ) tablet 500 mg  500 mg Oral Q8H PRN Poggi, John J, MD   500 mg at 11/18/23 2022   multivitamin with minerals tablet 1 tablet  1 tablet Oral Daily Poggi, Norleen PARAS, MD   1 tablet at 11/19/23 9045   oxybutynin  (DITROPAN ) tablet 5 mg  5 mg Oral BID Poggi, John J, MD   5 mg at 11/19/23 0954   oxyCODONE  (Oxy IR/ROXICODONE ) immediate release tablet 5-10  mg  5-10 mg Oral Q6H PRN Kandis Devaughn Sayres, MD   10 mg at 11/18/23 2022   polyethylene glycol (MIRALAX  / GLYCOLAX ) packet 34 g  34 g Oral Daily Kandis Devaughn Sayres, MD   34 g at 11/17/23 1400   senna (SENOKOT) tablet 8.6 mg  1 tablet Oral Daily Kandis Devaughn Sayres, MD   8.6 mg at 11/19/23 9045   simvastatin  (ZOCOR ) tablet 20 mg  20 mg Oral Daily Poggi, John J, MD   20 mg at 11/19/23 9045     Discharge Medications: Please see discharge summary for a list of discharge medications.  Relevant Imaging Results:  Relevant Lab Results:   Additional Information SS#[id number]  Marinda Cooks, RN

## 2023-11-20 DIAGNOSIS — S72002A Fracture of unspecified part of neck of left femur, initial encounter for closed fracture: Secondary | ICD-10-CM | POA: Diagnosis not present

## 2023-11-20 DIAGNOSIS — I82409 Acute embolism and thrombosis of unspecified deep veins of unspecified lower extremity: Secondary | ICD-10-CM | POA: Diagnosis not present

## 2023-11-20 LAB — CBC
HCT: 31.8 % — ABNORMAL LOW (ref 36.0–46.0)
Hemoglobin: 10.3 g/dL — ABNORMAL LOW (ref 12.0–15.0)
MCH: 29.8 pg (ref 26.0–34.0)
MCHC: 32.4 g/dL (ref 30.0–36.0)
MCV: 91.9 fL (ref 80.0–100.0)
Platelets: 169 K/uL (ref 150–400)
RBC: 3.46 MIL/uL — ABNORMAL LOW (ref 3.87–5.11)
RDW: 13.3 % (ref 11.5–15.5)
WBC: 6.6 K/uL (ref 4.0–10.5)
nRBC: 0 % (ref 0.0–0.2)

## 2023-11-20 LAB — BASIC METABOLIC PANEL WITH GFR
Anion gap: 7 (ref 5–15)
BUN: 18 mg/dL (ref 8–23)
CO2: 27 mmol/L (ref 22–32)
Calcium: 8.5 mg/dL — ABNORMAL LOW (ref 8.9–10.3)
Chloride: 104 mmol/L (ref 98–111)
Creatinine, Ser: 0.49 mg/dL (ref 0.44–1.00)
GFR, Estimated: >60 mL/min (ref >60)
Glucose, Bld: 157 mg/dL — ABNORMAL HIGH (ref 70–99)
Potassium: 4.2 mmol/L (ref 3.5–5.1)
Sodium: 138 mmol/L (ref 135–145)

## 2023-11-20 LAB — GLUCOSE, CAPILLARY
Glucose-Capillary: 139 mg/dL — ABNORMAL HIGH (ref 70–99)
Glucose-Capillary: 186 mg/dL — ABNORMAL HIGH (ref 70–99)
Glucose-Capillary: 156 mg/dL — ABNORMAL HIGH (ref 70–99)
Glucose-Capillary: 136 mg/dL — ABNORMAL HIGH (ref 70–99)

## 2023-11-20 MED ORDER — APIXABAN 5 MG PO TABS: 5.0000 mg | ORAL_TABLET | Freq: Two times a day (BID) | ORAL | Status: DC

## 2023-11-20 MED ORDER — AMOXICILLIN-POT CLAVULANATE 875-125 MG PO TABS
1.0000 | ORAL_TABLET | Freq: Two times a day (BID) | ORAL | Status: DC
  Administered 2023-11-20 – 2023-11-21 (×3): 1 via ORAL
  Filled 2023-11-20 (×3): qty 1

## 2023-11-20 MED ORDER — APIXABAN 5 MG PO TABS
10.0000 mg | ORAL_TABLET | Freq: Two times a day (BID) | ORAL | Status: DC
  Administered 2023-11-20 – 2023-11-21 (×3): 10 mg via ORAL
  Filled 2023-11-20 (×3): qty 2

## 2023-11-20 NOTE — Progress Notes (Signed)
 Progress Note   Patient: Cassandra Gill FMW:[id number] DOB: Sep 21, 1940 DOA: 11/15/2023     5 DOS: the patient was seen and examined on 11/20/2023   Brief hospital course:  Cassandra Gill is a 83 y.o. female with medical history significant of HTN, HLD, stress incontinence, blindness, who presents with fall and left hip pain.   Patient is blind, lives alone at home.  She states that she was walking and turned and lost her balance and fell.  She developed left hip pain, which is constant, sharp, moderate to severe, nonradiating, aggravated by movement.  She is unable to get up and bear weight on the left leg.  No chest pain, cough, SOB.  No nausea, vomiting, diarrhea or abdominal pain.  No symptoms of UTI.  Denies alcohol drinking.  Patient strongly denies any head or neck injury, she refused CT scan of head and neck.     Assessment and Plan:   Principal Problem:   Closed left hip fracture [facility]) Active Problems:   Fall at home, initial encounter   HTN (hypertension)   HLD (hyperlipidemia)   Blind   Moderate obesity    # Left intertrochanteric hip fracture S/p surgical repair on 8/28 with Dr. Edie Noted to have a drop in her H&H  which is stable Appreciate PT input Pain control, bowel regimen Awaiting discharge to SNF Currently on apixaban  for left peroneal DVT despite being on Lovenox  WBAT left leg    Left peroneal vein DVT Patient with complaints of left calf pain Left lower extremity venous Doppler showed nonocclusive thrombus in the left peroneal vein.  Continue apixaban  per DVT protocol     # Thrombocytopenia Stable Improving Monitor closely     # HTN Patient is normotensive     # Hyperglycemia Per patient, borderline diabetic Hemoglobin A1c is 6.8 which according to patient's daughter is better than it has been Maintain consistent carbohydrate diet Sliding scale insulin  Discussed with patient and her daughter the need to start oral hypoglycemic agents like  metformin, patient declined medication and wants to continue dietary modification     # OAB - home oxybutynin      # Pulmonary infiltrate vs mass On cxr. Asymptomatic.  CT scan of the chest showed mild bilateral subsegmental atelectasis and trace bilateral pleural effusions. No lung mass or adenopathy. Multinodular goiter with dominant 2.9 cm hypodense left thyroid  nodule;     # Goiter - has outpt u/s scheduled for next week     # Blind Requires assistance with some ADLs   # Obesity Class I BMI 33.79 Complicates overall prognosis and care Lifestyle modification and exercise has been discussed with patient in detail            Subjective: No new complaints  Physical Exam: Vitals:   11/19/23 1518 11/19/23 2014 11/20/23 0340 11/20/23 0714  BP: (!) 128/54 (!) 139/39 (!) 119/43 (!) 115/44  Pulse: 73 74 70 73  Resp: 17 16 16 17   Temp: 98.7 F (37.1 C) 98.8 F (37.1 C) 99.1 F (37.3 C) 98.4 F (36.9 C)  TempSrc: Oral   Oral  SpO2: 98% 97% 95% 96%  Weight:      Height:       General exam: Appears calm and comfortable  Respiratory system: Clear to auscultation. Respiratory effort normal. Cardiovascular system: S1 & S2 heard, RRR. Soft systolic murmur Gastrointestinal system: Abdomen is nondistended, soft and nontender. No organomegaly or masses felt.  Central nervous system: Alert and oriented. No focal neurological  deficits. Extremities: warm, distal sensation intact Skin: c/d/I bandages left hip Psychiatry: Judgement and insight appear normal. Mood & affect appropriate.      Data Reviewed: Hemoglobin 10.3, glucose 157 Labs reviewed  Family Communication: Plan of care discussed with patient's daughter Cassandra Gill over the phone.  All questions and concerns have been addressed.  She verbalizes understanding and agrees to the plan.  Disposition: Status is: Inpatient Remains inpatient appropriate because: For discharge in a.m.  Planned Discharge Destination:  Skilled nursing facility    Time spent: 50 minutes  Author: Aimee Somerset, MD 11/20/2023 12:43 PM  For on call review [email or web address].

## 2023-11-20 NOTE — Progress Notes (Signed)
 Physical Therapy Treatment Patient Details Name: Cassandra Gill MRN: [id number] DOB: 08-Nov-1940 Today's Date: 11/20/2023   History of Present Illness Cassandra Gill is a 83 y.o. female with medical history significant of HTN, HLD, stress incontinence, blindness, who presents with fall and left hip pain. Hip X-ray: Acute comminuted inter trochanteric fractures of the proximal left  femur now s/p ORIF on 11/16/23, WBAT.    PT Comments  Pt received upright in bed agreeable to PT. Increased time needed during session due to baseline visual deficits. Pt needs modA for supine to sitting EOB and minA to perform STS to RW with VC's for hand placement. Pt able to ambulate to <> from bathroom but reliant on CGA and miNA on REL to progress step through gait due to poor standing tolerance on LLE in stance phase due to pain/weakness but good stability with UE's on RW. Indep for toileting via urination and pericare in sitting. Able to STS and ambulate back to recliner at same assist levels. All needs in reach upright in recliner. Pt remains high falls risk with d/c recs remaining appropriate.    If plan is discharge home, recommend the following: A lot of help with walking and/or transfers;A lot of help with bathing/dressing/bathroom;Assistance with cooking/housework;Assist for transportation;Help with stairs or ramp for entrance   Can travel by private vehicle     No  Equipment Recommendations  Rolling walker (2 wheels)    Recommendations for Other Services       Precautions / Restrictions Precautions Precautions: Fall Recall of Precautions/Restrictions: Intact Precaution/Restrictions Comments: blind and a bit fearful but does well with encouragement Restrictions Weight Bearing Restrictions Per Provider Order: Yes LLE Weight Bearing Per Provider Order: Weight bearing as tolerated     Mobility  Bed Mobility Overal bed mobility: Needs Assistance Bed Mobility: Supine to Sit     Supine to sit: Mod  assist, Used rails, HOB elevated       Patient Response: Cooperative  Transfers Overall transfer level: Needs assistance Equipment used: Rolling walker (2 wheels) Transfers: Sit to/from Stand Sit to Stand: Min assist           General transfer comment: VC's for hand placement prior to standing    Ambulation/Gait Ambulation/Gait assistance: Min assist Gait Distance (Feet): 20 Feet Assistive device: Rolling walker (2 wheels) Gait Pattern/deviations: Antalgic, Step-through pattern, Decreased step length - right, Decreased step length - left, Decreased stance time - left       General Gait Details: minA on RLE for swing phase to progress limb due to poor stance tolerance on operative side (LLE)   Stairs             Wheelchair Mobility     Tilt Bed Tilt Bed Patient Response: Cooperative  Modified Rankin (Stroke Patients Only)       Balance Overall balance assessment: Needs assistance Sitting-balance support: No upper extremity supported, Feet supported Sitting balance-Leahy Scale: Fair     Standing balance support: Bilateral upper extremity supported Standing balance-Leahy Scale: Poor Standing balance comment: BUE support on RW needed.                            Communication Communication Communication: No apparent difficulties  Cognition Arousal: Alert Behavior During Therapy: WFL for tasks assessed/performed   PT - Cognitive impairments: No apparent impairments  Following commands: Intact      Cueing Cueing Techniques: Verbal cues  Exercises General Exercises - Lower Extremity Ankle Circles/Pumps: AROM, Both, 10 reps, Supine Quad Sets: AROM, Strengthening, Left, 10 reps, Supine Gluteal Sets: AROM, Both, 10 reps, Supine Short Arc Quad: AROM, Strengthening, Left, 10 reps, Supine Hip ABduction/ADduction: AROM, Strengthening, Left, Supine, AAROM    General Comments        Pertinent Vitals/Pain  Pain Assessment Pain Assessment: Faces Faces Pain Scale: Hurts little more Pain Location: LLE Pain Descriptors / Indicators: Grimacing, Aching, Operative site guarding Pain Intervention(s): Limited activity within patient's tolerance, Monitored during session, Repositioned    Home Living                          Prior Function            PT Goals (current goals can now be found in the care plan section) Acute Rehab PT Goals Patient Stated Goal: improve pain and mobility PT Goal Formulation: With patient Time For Goal Achievement: 12/01/23 Potential to Achieve Goals: Good Progress towards PT goals: Progressing toward goals    Frequency           PT Plan      Co-evaluation              AM-PAC PT 6 Clicks Mobility   Outcome Measure  Help needed turning from your back to your side while in a flat bed without using bedrails?: A Lot Help needed moving from lying on your back to sitting on the side of a flat bed without using bedrails?: A Lot Help needed moving to and from a bed to a chair (including a wheelchair)?: A Lot Help needed standing up from a chair using your arms (e.g., wheelchair or bedside chair)?: A Little Help needed to walk in hospital room?: A Lot Help needed climbing 3-5 steps with a railing? : Total 6 Click Score: 12    End of Session Equipment Utilized During Treatment: Gait belt Activity Tolerance: Patient tolerated treatment well Patient left: in chair;with call bell/phone within reach;with chair alarm set Nurse Communication: Mobility status PT Visit Diagnosis: Unsteadiness on feet (R26.81);Other abnormalities of gait and mobility (R26.89);Muscle weakness (generalized) (M62.81);Pain;Difficulty in walking, not elsewhere classified (R26.2) Pain - Right/Left: Left Pain - part of body: Hip     Time: 9088-9045 PT Time Calculation (min) (ACUTE ONLY): 43 min  Charges:    $Gait Training: 23-37 mins $Therapeutic Exercise: 8-22  mins PT General Charges $$ ACUTE PT VISIT: 1 Visit                    [name]. Fairly IV, PT, DPT Physical Therapist- [place]  [facility] 11/20/2023, 10:50 AM

## 2023-11-20 NOTE — Progress Notes (Signed)
  Subjective: 4 Days Post-Op Procedure(s) (LRB): FIXATION, FRACTURE, INTERTROCHANTERIC, WITH INTRAMEDULLARY ROD (Left) Patient reports pain as mild in the left hip. Moderate pain when working with PT. Patient is well, and has had no acute complaints or problems Is experiencing some worsening calf pain in the left leg.  US  yesterday did demonstrate a DVT, switched to Eliquis  PT and care management to assist with discharge planning. Patient lives at the [facility] [place], plan for discharge to SNF at the [facility]. Negative for chest pain and shortness of breath Fever: None Gastrointestinal:Negative for nausea and vomiting She has had a BM.  Objective: Vital signs in last 24 hours: Temp:  [98.4 F (36.9 C)-99.1 F (37.3 C)] 98.4 F (36.9 C) (09/01 0714) Pulse Rate:  [70-74] 73 (09/01 0714) Resp:  [16-17] 17 (09/01 0714) BP: (115-139)/(39-54) 115/44 (09/01 0714) SpO2:  [95 %-98 %] 96 % (09/01 0714)  Intake/Output from previous day:  Intake/Output Summary (Last 24 hours) at 11/20/2023 0759 Last data filed at 11/20/2023 0439 Gross per 24 hour  Intake 480 ml  Output 550 ml  Net -70 ml    Intake/Output this shift: No intake/output data recorded.  Labs: Recent Labs    11/18/23 0604 11/19/23 0604 11/20/23 0534  HGB 10.6* 10.3* 10.3*   Recent Labs    11/19/23 0604 11/20/23 0534  WBC 7.1 6.6  RBC 3.51* 3.46*  HCT 33.0* 31.8*  PLT 150 169   Recent Labs    11/19/23 0604 11/20/23 0534  NA 141 138  K 4.3 4.2  CL 108 104  CO2 29 27  BUN 22 18  CREATININE 0.63 0.49  GLUCOSE 145* 157*  CALCIUM 8.7* 8.5*   No results for input(s): LABPT, INR in the last 72 hours.    EXAM General - Patient is Alert, Appropriate, and Oriented Extremity - ABD soft Neurovascular intact Dorsiflexion/Plantar flexion intact Incision: dressing C/D/I No cellulitis present Positive homans test to the left leg. Dressing/Incision - clean, dry, no drainage noted to the left  hip. Motor Function - intact, moving foot and toes well on exam.  Abdomen soft with intact bowel sounds.  Past Medical History:  Diagnosis Date   Blind    High cholesterol    HTN (hypertension)     Assessment/Plan: 4 Days Post-Op Procedure(s) (LRB): FIXATION, FRACTURE, INTERTROCHANTERIC, WITH INTRAMEDULLARY ROD (Left) Principal Problem:   Closed left hip fracture (HCC) Active Problems:   Moderate obesity   HLD (hyperlipidemia)   HTN (hypertension)   Blind   Fall at home, initial encounter  Estimated body mass index is 33.79 kg/m as calculated from the following:   Height as of this encounter: 5' (1.524 m).   Weight as of this encounter: 78.5 kg. Advance diet Up with therapy D/C IV fluids when tolerating po intake.  Labs and vitals reviewed this AM. WBC 6.6, Hg 10.3 Up with therapy today. Patient has had a BM. US  demonstrated evidence of a DVT, switched to Eliquis  for DVT treatment.  Following discharge, continue Eliquis  Follow-up with [facility] in 10-14 days for staple removal and x-rays of the left femur.  DVT Prophylaxis - Lovenox  and TED hose Weight-Bearing as tolerated to left leg  J. Gustavo Level, PA-C [facility] 11/20/2023, 7:59 AM

## 2023-11-20 NOTE — Plan of Care (Signed)
  Problem: Education: Goal: Knowledge of General Education information will improve Description: Including pain rating scale, medication(s)/side effects and non-pharmacologic comfort measures Outcome: Progressing   Problem: Clinical Measurements: Goal: Diagnostic test results will improve Outcome: Progressing   Problem: Coping: Goal: Level of anxiety will decrease Outcome: Progressing   Problem: Pain Managment: Goal: General experience of comfort will improve and/or be controlled Outcome: Progressing   Problem: Metabolic: Goal: Ability to maintain appropriate glucose levels will improve Outcome: Progressing

## 2023-11-20 NOTE — Plan of Care (Signed)
   Problem: Activity: Goal: Risk for activity intolerance will decrease Outcome: Progressing   Problem: Nutrition: Goal: Adequate nutrition will be maintained Outcome: Progressing   Problem: Coping: Goal: Level of anxiety will decrease Outcome: Progressing

## 2023-11-21 ENCOUNTER — Other Ambulatory Visit (HOSPITAL_COMMUNITY): Payer: Self-pay

## 2023-11-21 ENCOUNTER — Other Ambulatory Visit: Payer: Self-pay

## 2023-11-21 ENCOUNTER — Telehealth (HOSPITAL_COMMUNITY): Payer: Self-pay | Admitting: Pharmacy Technician

## 2023-11-21 DIAGNOSIS — S72002A Fracture of unspecified part of neck of left femur, initial encounter for closed fracture: Secondary | ICD-10-CM | POA: Diagnosis not present

## 2023-11-21 LAB — CBC
HCT: 31.4 % — ABNORMAL LOW (ref 36.0–46.0)
Hemoglobin: 10.5 g/dL — ABNORMAL LOW (ref 12.0–15.0)
MCH: 30.4 pg (ref 26.0–34.0)
MCHC: 33.4 g/dL (ref 30.0–36.0)
MCV: 91 fL (ref 80.0–100.0)
Platelets: 181 K/uL (ref 150–400)
RBC: 3.45 MIL/uL — ABNORMAL LOW (ref 3.87–5.11)
RDW: 13.6 % (ref 11.5–15.5)
WBC: 6.5 K/uL (ref 4.0–10.5)
nRBC: 0 % (ref 0.0–0.2)

## 2023-11-21 LAB — URINE CULTURE: Culture: 40000 — AB

## 2023-11-21 LAB — BASIC METABOLIC PANEL WITH GFR
Anion gap: 7 (ref 5–15)
BUN: 19 mg/dL (ref 8–23)
CO2: 27 mmol/L (ref 22–32)
Calcium: 8.6 mg/dL — ABNORMAL LOW (ref 8.9–10.3)
Chloride: 103 mmol/L (ref 98–111)
Creatinine, Ser: 0.55 mg/dL (ref 0.44–1.00)
GFR, Estimated: >60 mL/min (ref >60)
Glucose, Bld: 136 mg/dL — ABNORMAL HIGH (ref 70–99)
Potassium: 4.4 mmol/L (ref 3.5–5.1)
Sodium: 137 mmol/L (ref 135–145)

## 2023-11-21 LAB — GLUCOSE, CAPILLARY
Glucose-Capillary: 171 mg/dL — ABNORMAL HIGH (ref 70–99)
Glucose-Capillary: 172 mg/dL — ABNORMAL HIGH (ref 70–99)

## 2023-11-21 MED ORDER — POLYETHYLENE GLYCOL 3350 17 G PO PACK
34.0000 g | PACK | Freq: Every day | ORAL | 0 refills | Status: DC
Start: 2023-11-21 — End: 2023-11-21

## 2023-11-21 MED ORDER — AMOXICILLIN-POT CLAVULANATE 875-125 MG PO TABS: 1.0000 | ORAL_TABLET | Freq: Two times a day (BID) | ORAL | 0 refills | Status: DC

## 2023-11-21 MED ORDER — APIXABAN 5 MG PO TABS
ORAL_TABLET | ORAL | 0 refills | Status: AC
  Filled 2023-11-21: qty 70, 30d supply, fill #0

## 2023-11-21 MED ORDER — APIXABAN 5 MG PO TABS: ORAL_TABLET | ORAL | 0 refills | Status: DC

## 2023-11-21 MED ORDER — ACETAMINOPHEN 325 MG PO TABS: 650.0000 mg | ORAL_TABLET | Freq: Four times a day (QID) | ORAL | 0 refills | Status: DC | PRN

## 2023-11-21 MED ORDER — OXYBUTYNIN CHLORIDE 5 MG PO TABS
5.0000 mg | ORAL_TABLET | Freq: Two times a day (BID) | ORAL | 0 refills | Status: AC
  Filled 2023-11-21: qty 60, 30d supply, fill #0

## 2023-11-21 MED ORDER — ACETAMINOPHEN 325 MG PO TABS
650.0000 mg | ORAL_TABLET | Freq: Four times a day (QID) | ORAL | 0 refills | Status: AC | PRN
  Filled 2023-11-21: qty 200, 25d supply, fill #0

## 2023-11-21 MED ORDER — AMOXICILLIN-POT CLAVULANATE 875-125 MG PO TABS
1.0000 | ORAL_TABLET | Freq: Two times a day (BID) | ORAL | 0 refills | Status: AC
  Filled 2023-11-21: qty 6, 3d supply, fill #0

## 2023-11-21 MED ORDER — POLYETHYLENE GLYCOL 3350 17 GM/SCOOP PO POWD
34.0000 g | Freq: Every day | ORAL | 0 refills | Status: AC
  Filled 2023-11-21: qty 238, 7d supply, fill #0

## 2023-11-21 NOTE — TOC Transition Note (Addendum)
 Transition of Care [facility]) - Discharge Note   Patient Details  Name: Cassandra Gill MRN: [id number] Date of Birth: 1940-06-26  [facility] CM/SW Contact:  Alvaro Louder, LCSW Phone Number: 11/21/2023, 11:59 AM   Clinical Narrative:     LCSWA received insurance approval for patient to admit to [facility]. LCSWA confirmed with MD that patient is stable for discharge. LCSWA notified the patient and they are in agreement with discharge. LCSWA confirmed bed is available at SNF. Transport arranged with [facility] for next available.  Number to call report [phone] RM: [phone]   TOC to follow for DC   Final next level of care: Skilled Nursing Facility Barriers to Discharge: No Barriers Identified   Patient Goals and CMS Choice            Discharge Placement              Patient chooses bed at: [facility] Patient to be transferred to facility by: [facility] Name of family member notified: Montie Patient and family notified of of transfer: 11/21/23  Discharge Plan and Services Additional resources added to the After Visit Summary for                                       Social Drivers of Health (SDOH) Interventions SDOH Screenings   Food Insecurity: No Food Insecurity (11/16/2023)  Housing: Low Risk  (11/16/2023)  Transportation Needs: No Transportation Needs (11/15/2023)  Utilities: Not At Risk (11/15/2023)  Depression (PHQ2-9): Low Risk  (07/13/2021)  Social Connections: Moderately Integrated (11/16/2023)  Tobacco Use: Low Risk  (07/26/2023)     Readmission Risk Interventions     No data to display

## 2023-11-21 NOTE — Telephone Encounter (Signed)
 Patient Product/process development scientist completed.    The patient is insured through [employer]. Patient has Medicare and is not eligible for a copay card, but may be able to apply for patient assistance or Medicare RX Payment Plan (Patient Must reach out to their plan, if eligible for payment plan), if available.    Ran test claim for Eliquis  5 mg and the current 30 day co-pay is $606.44 due to a  $590.00 deductible.   This test claim was processed through [facility]- copay amounts may vary at other pharmacies due to pharmacy/plan contracts, or as the patient moves through the different stages of their insurance plan.     Cassandra Gill, CPHT Pharmacy Technician III Certified Patient Advocate [facility] Patient Advocate Team Direct Number: [phone]  Fax: [phone]

## 2023-11-21 NOTE — Progress Notes (Signed)
 Mobility Specialist - Progress Note     11/21/23 1133  Mobility  Activity Stood at bedside  Level of Assistance Contact guard assist, steadying assist  Assistive Device Front wheel walker  Range of Motion/Exercises Active  LLE Weight Bearing Per Provider Order WBAT  Activity Response Tolerated well  Mobility Referral Yes  Mobility visit 1 Mobility  Mobility Specialist Start Time (ACUTE ONLY) 1117  Mobility Specialist Stop Time (ACUTE ONLY) 1133  Mobility Specialist Time Calculation (min) (ACUTE ONLY) 16 min   Pt resting in recliner on RA upon. Pt STS with RW CGA (MS only on stand by for safety) during stand and CGA during stand to prevent sway. Pt performed ankle rotations and arm/ leg press downs seated. Pt left in chair with needs in reach and chair alarm activated.   Guido Rumble Mobility Specialist 11/21/23, 11:37 AM

## 2023-11-21 NOTE — Progress Notes (Addendum)
 Transfer report called to receiving nurse at [facility].

## 2023-11-21 NOTE — Progress Notes (Signed)
  Subjective: 5 Days Post-Op Procedure(s) (LRB): FIXATION, FRACTURE, INTERTROCHANTERIC, WITH INTRAMEDULLARY ROD (Left) Patient reports pain as mild in the left hip this morning. Patient is well, and has had no acute complaints or problems Left leg DVT, currently on Eliquis . PT and care management to assist with discharge planning, current plan is for d/c to the [facility]. Negative for chest pain and shortness of breath Fever: None Gastrointestinal:Negative for nausea and vomiting She has had a BM.  Objective: Vital signs in last 24 hours: Temp:  [98.8 F (37.1 C)-99.9 F (37.7 C)] 99.7 F (37.6 C) (09/02 0330) Pulse Rate:  [68-77] 68 (09/02 0330) Resp:  [16-19] 17 (09/02 0330) BP: (113-148)/(41-50) 113/41 (09/02 0330) SpO2:  [96 %-100 %] 96 % (09/02 0330)  Intake/Output from previous day:  Intake/Output Summary (Last 24 hours) at 11/21/2023 0732 Last data filed at 11/21/2023 0325 Gross per 24 hour  Intake 720 ml  Output 1150 ml  Net -430 ml    Intake/Output this shift: No intake/output data recorded.  Labs: Recent Labs    11/19/23 0604 11/20/23 0534 11/21/23 0452  HGB 10.3* 10.3* 10.5*   Recent Labs    11/20/23 0534 11/21/23 0452  WBC 6.6 6.5  RBC 3.46* 3.45*  HCT 31.8* 31.4*  PLT 169 181   Recent Labs    11/20/23 0534 11/21/23 0452  NA 138 137  K 4.2 4.4  CL 104 103  CO2 27 27  BUN 18 19  CREATININE 0.49 0.55  GLUCOSE 157* 136*  CALCIUM 8.5* 8.6*   No results for input(s): LABPT, INR in the last 72 hours.    EXAM General - Patient is Alert, Appropriate, and Oriented.  Currently on bedside commode when I enter the room this AM. Extremity - ABD soft Neurovascular intact Dorsiflexion/Plantar flexion intact Incision: dressing C/D/I No cellulitis present Dressing/Incision - clean, dry, no drainage noted to the left hip. Motor Function - intact, moving foot and toes well on exam.  Abdomen soft with intact bowel sounds.  Past  Medical History:  Diagnosis Date   Blind    High cholesterol    HTN (hypertension)     Assessment/Plan: 5 Days Post-Op Procedure(s) (LRB): FIXATION, FRACTURE, INTERTROCHANTERIC, WITH INTRAMEDULLARY ROD (Left) Principal Problem:   Closed left hip fracture (HCC) Active Problems:   Moderate obesity   HLD (hyperlipidemia)   HTN (hypertension)   Blind   Fall at home, initial encounter   DVT (deep venous thrombosis) (HCC)  Estimated body mass index is 33.79 kg/m as calculated from the following:   Height as of this encounter: 5' (1.524 m).   Weight as of this encounter: 78.5 kg. Advance diet Up with therapy D/C IV fluids when tolerating po intake.  Labs and vitals reviewed this AM. WBC 6.5, Hg 10.5 Up with therapy today. Patient has had a BM. Discharge on Eliquis  for left leg DVT.  Following discharge, continue Eliquis  Follow-up with [facility] in 10-14 days for staple removal and x-rays of the left femur.  DVT Prophylaxis - TED hose and Eliquis  Weight-Bearing as tolerated to left leg  J. Gustavo Level, PA-C [facility] 11/21/2023, 7:32 AM

## 2023-11-21 NOTE — Progress Notes (Signed)
 Paper prescription for oxycodone  placed in discharge packet for EMS to provide to facility.

## 2023-11-21 NOTE — Plan of Care (Signed)
  Problem: Education: Goal: Knowledge of General Education information will improve Description: Including pain rating scale, medication(s)/side effects and non-pharmacologic comfort measures Outcome: Progressing   Problem: Clinical Measurements: Goal: Diagnostic test results will improve Outcome: Progressing   Problem: Nutrition: Goal: Adequate nutrition will be maintained Outcome: Progressing   Problem: Coping: Goal: Level of anxiety will decrease Outcome: Progressing   Problem: Pain Managment: Goal: General experience of comfort will improve and/or be controlled Outcome: Progressing

## 2023-11-21 NOTE — Discharge Summary (Addendum)
 Physician Discharge Summary   Patient: Cassandra Gill MRN: [id number] DOB: May 26, 1940  Admit date:     11/15/2023  Discharge date: 11/21/23  Discharge Physician: Robertt Buda   PCP: Diedra Lame, MD   Recommendations at discharge:   Take anticoagulant therapy as recommended Keep scheduled follow-up appointment with orthopedic surgery  Discharge Diagnoses: Principal Problem:   Closed left hip fracture [facility]) Active Problems:   Fall at home, initial encounter   HTN (hypertension)   HLD (hyperlipidemia)   Blind   Obesity (BMI 30-39.9)   DVT (deep venous thrombosis) (HCC)  Resolved Problems:   * No resolved hospital problems. *  Hospital Course:  Cassandra Gill is a 83 y.o. female with medical history significant of HTN, HLD, stress incontinence, blindness, who presents with fall and left hip pain.   Patient is a deeply blind, lives alone at home.  She states that she was waling and turning and lost her balance and fell.  She developed left hip pain, which is constant, sharp, moderate to severe, nonradiating, aggravated by movement.  She is unable to get up and bear weight on the left leg.  No chest pain, cough, SOB.  No nausea, vomiting, diarrhea or abdominal pain.  No symptoms of UTI.  Denies alcohol drinking.  Patient strongly denies any head or neck injury, she refused CT scan of head and neck.   Data reviewed independently and ED Course: pt was found to have WBC 10.5, GFR> 60.  Temperature normal, blood pressure 151/64, heart rate 66, oxygen saturation 98% on room air.  X-ray of left hip/pelvis showed acute comminuted inter trochanteric fractures of the proximal left femur with varus angulation.  Patient is admitted to MedSurg bed as inpatient.  CXR: 1. Right perihilar and suprahilar opacity may represent consolidation or pneumonia versus lymphadenopathy or mass lesion. CT chest suggested for further evaluation. 2. Left lung is clear.      Assessment and  Plan:   Principal Problem:   Closed left hip fracture [facility]) Active Problems:   Fall at home, initial encounter   HTN (hypertension)   HLD (hyperlipidemia)   Blind   Moderate obesity     # Left intertrochanteric hip fracture S/p surgical repair on 8/28 with Dr. Edie Noted to have a drop in her H&H  which remains stable Appreciate PT input Pain control, bowel regimen Currently on apixaban  for left peroneal DVT despite being on Lovenox  WBAT left leg For discharge today       Left peroneal vein DVT Patient with complaints of left calf pain Left lower extremity venous Doppler showed nonocclusive thrombus in the left peroneal vein.  Continue apixaban  per DVT protocol to complete a 3 to 77-month course of therapy     # Thrombocytopenia Stable Improving Monitor closely     # HTN Patient is normotensive     # Hyperglycemia Per patient, borderline diabetic Hemoglobin A1c is 6.8 which according to patient's daughter is better than it has been Maintain consistent carbohydrate diet Patient was placed on sliding scale insulin  during this hospitalization Discussed with patient and her daughter the need to start oral hypoglycemic agents like metformin, patient declined medication and wants to continue dietary modification     # OAB - home oxybutynin      # Pulmonary infiltrate vs mass On cxr. Asymptomatic.  CT scan of the chest showed mild bilateral subsegmental atelectasis and trace bilateral pleural effusions. No lung mass or adenopathy. Multinodular goiter with dominant 2.9 cm hypodense left thyroid  nodule;     #  Goiter - has outpt u/s scheduled for next week     # Blind Requires assistance with some ADLs   # Obesity Class I BMI 33.79 Complicates overall prognosis and care Lifestyle modification and exercise has been discussed with patient in detail             Consultants: Orthopedic surgery Procedures performed: Reduction and internal fixation of  placed intertrochanteric left hip fracture Disposition: Skilled nursing facility Diet recommendation:  Discharge Diet Orders (From admission, onward)     Start     Ordered   11/21/23 0000  Diet - low sodium heart healthy        11/21/23 0821   11/21/23 0000  Diet Carb Modified        11/21/23 0821           Cardiac and Carb modified diet DISCHARGE MEDICATION: Allergies as of 11/21/2023   No Known Allergies      Medication List     STOP taking these medications    furosemide  20 MG tablet Commonly known as: LASIX        TAKE these medications    acetaminophen  325 MG tablet Commonly known as: TYLENOL  Take 2 tablets (650 mg total) by mouth every 6 (six) hours as needed for mild pain (pain score 1-3), fever or headache.   amoxicillin -clavulanate 875-125 MG tablet Commonly known as: AUGMENTIN  Take 1 tablet by mouth every 12 (twelve) hours for 3 days.   Eliquis  5 MG Tabs tablet Generic drug: apixaban  Take 2 tablets (10 mg total) by mouth 2 (two) times daily for 5 days, THEN 1 tablet (5 mg total) 2 (two) times daily. Start taking on: November 21, 2023   oxybutynin  5 MG tablet Commonly known as: DITROPAN  Take 1 tablet (5 mg total) by mouth 2 (two) times daily. What changed: See the new instructions.   oxyCODONE  5 MG immediate release tablet Commonly known as: Oxy IR/ROXICODONE  Take 1-2 tablets (5-10 mg total) by mouth every 6 (six) hours as needed for moderate pain (pain score 4-6) or severe pain (pain score 7-10).   polyethylene glycol powder 17 GM/SCOOP powder Commonly known as: GLYCOLAX /MIRALAX  Take 2 capfuls (34 g) by mouth daily. Mix as directed.   simvastatin  20 MG tablet Commonly known as: ZOCOR  Take 20 mg by mouth daily.        Contact information for follow-up providers     Kip Lynwood Double, PA-C Follow up on 12/01/2023.   Specialty: Physician Assistant Why: Staple removal, x-rays of the left femur.at 1030 Contact information: [address] [place] [address] [phone]              Contact information for after-discharge care     Destination     [facility] .   Service: Skilled Nursing Contact information: [address] [place]   [address] [phone]                    Discharge Exam: Filed Weights   11/15/23 2300 11/16/23 1536  Weight: 77.5 kg 78.5 kg   General exam: Appears calm and comfortable  Respiratory system: Clear to auscultation. Respiratory effort normal. Cardiovascular system: S1 & S2 heard, RRR. Soft systolic murmur Gastrointestinal system: Abdomen is nondistended, soft and nontender. No organomegaly or masses felt.  Central nervous system: Alert and oriented. No focal neurological deficits. Extremities: warm, distal sensation intact, left leg swelling Skin: c/d/I bandages left hip Psychiatry: Judgement and insight appear normal. Mood & affect appropriate.  Condition at discharge: stable  The results of significant diagnostics from this hospitalization (including imaging, microbiology, ancillary and laboratory) are listed below for reference.   Imaging Studies: US  Venous Img Lower Unilateral Left (DVT) Addendum Date: 11/19/2023 ADDENDUM REPORT: 11/19/2023 15:01 ADDENDUM: Critical Value/emergent results were called by telephone at the time of interpretation on 11/19/2023 at 3:00 pm to provider Froylan, who verbally acknowledged these results. Electronically Signed   By: Leita Birmingham M.D.   On: 11/19/2023 15:01   Result Date: 11/19/2023 CLINICAL DATA:  Pain.  Recent fever fracture and surgery. EXAM: LEFT LOWER EXTREMITY VENOUS DOPPLER ULTRASOUND TECHNIQUE: Gray-scale sonography with compression, as well as color and duplex ultrasound, were performed to evaluate the deep venous system(s) from the level of the common femoral vein through the popliteal and proximal calf veins. COMPARISON:  None Available. FINDINGS: VENOUS Normal  compressibility of the common femoral, superficial femoral, popliteal veins, and posterior tibial vein at. There is nonocclusive thrombus in the peroneal vein. Visualized portions of profunda femoral vein and great saphenous vein unremarkable. No filling defects to suggest DVT on remaining grayscale or color Doppler imaging. Doppler waveforms show normal direction of venous flow, normal respiratory plasticity and response to augmentation. Limited views of the contralateral common femoral vein are unremarkable. OTHER A circumscribed hypervascular mass is noted anterior to the knee measuring 2.4 x 1.8 cm. Limitations: none IMPRESSION: 1. Nonocclusive thrombus in the left peroneal vein. 2. Circumscribed hypervascular in other mass anterior to the knee of unknown etiology. Electronically Signed: By: Leita Birmingham M.D. On: 11/19/2023 14:50   CT CHEST WO CONTRAST Result Date: 11/17/2023 EXAM: CT CHEST WITHOUT CONTRAST 11/17/2023 07:20:51 AM TECHNIQUE: CT of the chest was performed without the administration of intravenous contrast. Multiplanar reformatted images are provided for review. Automated exposure control, iterative reconstruction, and/or weight based adjustment of the mA/kV was utilized to reduce the radiation dose to as low as reasonably achievable. COMPARISON: 11/15/2023 chest radiograph CLINICAL HISTORY: Pulmonary mass. FINDINGS: MEDIASTINUM AND LYMPH NODES: Left anterior descending coronary atherosclerosis. Atherosclerotic nonaneurysmal thoracic aorta. Multinodular goiter with dominant hypodense 2.9 cm left thyroid  nodule. LUNGS AND PLEURA: Trace layering bilateral pleural effusions. Subsegmental atelectasis in the anterior right upper lobe and in the dependent mid to lower lungs bilaterally. No acute consolidative airspace disease, lung masses or significant pulmonary nodules. 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 Flowing syndesmophytes throughout the  thoracic spine. UPPER ABDOMEN: Limited images of the upper abdomen demonstrates no acute abnormality. IMPRESSION: 1. Mild bilateral subsegmental atelectasis and trace bilateral pleural effusions. 2. No lung mass or adenopathy. 3. Multinodular goiter with dominant 2.9 cm hypodense left thyroid  nodule; correlate with 01/04/2022 thyroid  ultrasound report. Electronically signed by: Selinda Blue MD 11/17/2023 09:25 AM EDT RP Workstation: [id number]   DG HIP UNILAT WITH PELVIS 2-3 VIEWS LEFT Result Date: 11/16/2023 CLINICAL DATA:  ORIF of the left femur. EXAM: DG HIP (WITH OR WITHOUT PELVIS) 2-3V LEFT COMPARISON:  Radiograph dated 11/15/2023. FINDINGS: Four intraoperative fluoroscopic spot images provided. The total fluoroscopic time is 1 minute 3 seconds with a cumulative air Karma of 33.4 mGy. Status post ORIF of the left femur. IMPRESSION: Status post ORIF of the left femur. Electronically Signed   By: Vanetta Chou M.D.   On: 11/16/2023 17:27   DG C-Arm 1-60 Min-No Report Result Date: 11/16/2023 Fluoroscopy was utilized by the requesting physician.  No radiographic interpretation.   DG Chest 1 View Result Date: 11/15/2023 CLINICAL  DATA:  Left hip pain after a fall. EXAM: CHEST  1 VIEW COMPARISON:  None Available. FINDINGS: Heart size and pulmonary vascularity are normal. Right superior perihilar fullness may represent focal consolidation/pneumonia but could indicate lymphadenopathy or mass lesion. CT chest suggested for further evaluation. Left lung is clear. No pleural effusion or pneumothorax. IMPRESSION: 1. Right perihilar and suprahilar opacity may represent consolidation or pneumonia versus lymphadenopathy or mass lesion. CT chest suggested for further evaluation. 2. Left lung is clear. Electronically Signed   By: Elsie Gravely M.D.   On: 11/15/2023 21:38   DG Hip Unilat W or Wo Pelvis 2-3 Views Left Result Date: 11/15/2023 CLINICAL DATA:  Left hip pain after a fall. EXAM: DG HIP (WITH OR WITHOUT  PELVIS) 2-3V LEFT COMPARISON:  None Available. FINDINGS: Acute comminuted inter trochanteric fractures of the proximal left femur with varus angulation. No dislocation at the hip joint. Pelvis appears intact. Previous right hip arthroplasty. Vascular calcifications. Calcified phleboliths in the pelvis. IMPRESSION: Acute comminuted inter trochanteric fractures of the proximal left femur with varus angulation. Electronically Signed   By: Elsie Gravely M.D.   On: 11/15/2023 21:37    Microbiology: Results for orders placed or performed during the hospital encounter of 11/15/23  Urine Culture     Status: Abnormal   Collection Time: 11/17/23  6:51 PM   Specimen: Urine, Clean Catch  Result Value Ref Range Status   Specimen Description   Final    URINE, CLEAN CATCH Performed at [facility], [address], [place], [place] [address]    Special Requests   Final    NONE Performed at [facility], [address] [place], [place] [address]    Culture (A)  Final    40,000 COLONIES/mL ENTEROCOCCUS FAECALIS WITHIN MIXED ORGANISMS Performed at [facility], [address] [place], [place] [address]    Report Status 11/21/2023 FINAL  Final   Organism ID, Bacteria ENTEROCOCCUS FAECALIS (A)  Final      Susceptibility   Enterococcus faecalis - MIC*    AMPICILLIN <=2 SENSITIVE Sensitive     NITROFURANTOIN <=16 SENSITIVE Sensitive     VANCOMYCIN 2 SENSITIVE Sensitive     * 40,000 COLONIES/mL ENTEROCOCCUS FAECALIS    Labs: CBC: Recent Labs  Lab 11/15/23 2050 11/16/23 0507 11/17/23 0405 11/18/23 0604 11/19/23 0604 11/20/23 0534 11/21/23 0452  WBC 10.5   < > 9.0 10.2 7.1 6.6 6.5  NEUTROABS 7.4  --   --   --   --   --   --   HGB 13.8   < > 12.0 10.6* 10.3* 10.3* 10.5*  HCT 43.8   < > 36.7 32.2* 33.0* 31.8* 31.4*  MCV 92.4   < > 91.5 91.0 94.0 91.9 91.0  PLT 171   < > 129* 135* 150 169 181   < > = values in this interval not displayed.   Basic Metabolic  Panel: Recent Labs  Lab 11/17/23 0405 11/18/23 0604 11/19/23 0604 11/20/23 0534 11/21/23 0452  NA 139 138 141 138 137  K 4.2 4.1 4.3 4.2 4.4  CL 108 107 108 104 103  CO2 25 24 29 27 27   GLUCOSE 194* 175* 145* 157* 136*  BUN 16 29* 22 18 19   CREATININE 0.55 0.52 0.63 0.49 0.55  CALCIUM 8.5* 8.6* 8.7* 8.5* 8.6*   Liver Function Tests: No results for input(s): AST, ALT, ALKPHOS, BILITOT, PROT, ALBUMIN in the last 168 hours. CBG: Recent Labs  Lab  11/20/23 0717 11/20/23 1130 11/20/23 1633 11/20/23 2149 11/21/23 0837  GLUCAP 139* 186* 156* 136* 171*    Discharge time spent: greater than 30 minutes.  Signed: Aimee Somerset, MD [facility] 11/21/2023

## 2023-11-21 NOTE — Plan of Care (Signed)
  Problem: Education: Goal: Knowledge of General Education information will improve Description: Including pain rating scale, medication(s)/side effects and non-pharmacologic comfort measures Outcome: Adequate for Discharge   Problem: Health Behavior/Discharge Planning: Goal: Ability to manage health-related needs will improve Outcome: Adequate for Discharge   Problem: Clinical Measurements: Goal: Ability to maintain clinical measurements within normal limits will improve Outcome: Adequate for Discharge Goal: Will remain free from infection Outcome: Adequate for Discharge Goal: Diagnostic test results will improve Outcome: Adequate for Discharge Goal: Respiratory complications will improve Outcome: Adequate for Discharge Goal: Cardiovascular complication will be avoided Outcome: Adequate for Discharge   Problem: Activity: Goal: Risk for activity intolerance will decrease Outcome: Adequate for Discharge   Problem: Nutrition: Goal: Adequate nutrition will be maintained Outcome: Adequate for Discharge   Problem: Coping: Goal: Level of anxiety will decrease Outcome: Adequate for Discharge   Problem: Elimination: Goal: Will not experience complications related to bowel motility Outcome: Adequate for Discharge Goal: Will not experience complications related to urinary retention Outcome: Adequate for Discharge   Problem: Pain Managment: Goal: General experience of comfort will improve and/or be controlled Outcome: Adequate for Discharge   Problem: Safety: Goal: Ability to remain free from injury will improve Outcome: Adequate for Discharge   Problem: Skin Integrity: Goal: Risk for impaired skin integrity will decrease Outcome: Adequate for Discharge   Problem: Increased Nutrient Needs (NI-5.1) Goal: Food and/or nutrient delivery Description: Individualized approach for food/nutrient provision. Outcome: Adequate for Discharge   Problem: Acute Rehab OT Goals (only OT  should resolve) Goal: Pt. Will Perform Grooming Outcome: Adequate for Discharge Goal: Pt. Will Perform Lower Body Dressing Outcome: Adequate for Discharge Goal: Pt. Will Transfer To Toilet Outcome: Adequate for Discharge   Problem: Acute Rehab PT Goals(only PT should resolve) Goal: Pt Will Go Supine/Side To Sit Outcome: Adequate for Discharge Goal: Patient Will Transfer Sit To/From Stand Outcome: Adequate for Discharge Goal: Pt Will Transfer Bed To Chair/Chair To Bed Outcome: Adequate for Discharge Goal: Pt Will Ambulate Outcome: Adequate for Discharge Goal: Pt/caregiver will Perform Home Exercise Program Outcome: Adequate for Discharge   Problem: Education: Goal: Ability to describe self-care measures that may prevent or decrease complications (Diabetes Survival Skills Education) will improve Outcome: Adequate for Discharge Goal: Individualized Educational Video(s) Outcome: Adequate for Discharge   Problem: Coping: Goal: Ability to adjust to condition or change in health will improve Outcome: Adequate for Discharge   Problem: Fluid Volume: Goal: Ability to maintain a balanced intake and output will improve Outcome: Adequate for Discharge   Problem: Health Behavior/Discharge Planning: Goal: Ability to identify and utilize available resources and services will improve Outcome: Adequate for Discharge Goal: Ability to manage health-related needs will improve Outcome: Adequate for Discharge   Problem: Metabolic: Goal: Ability to maintain appropriate glucose levels will improve Outcome: Adequate for Discharge   Problem: Nutritional: Goal: Maintenance of adequate nutrition will improve Outcome: Adequate for Discharge Goal: Progress toward achieving an optimal weight will improve Outcome: Adequate for Discharge   Problem: Skin Integrity: Goal: Risk for impaired skin integrity will decrease Outcome: Adequate for Discharge   Problem: Tissue Perfusion: Goal: Adequacy of  tissue perfusion will improve Outcome: Adequate for Discharge

## 2023-11-22 LAB — THYROID PANEL WITH TSH
Free Thyroxine Index: 2.9 (ref 1.2–4.9)
T3 Uptake Ratio: 31 % (ref 24–39)
T4, Total: 9.3 ug/dL (ref 4.5–12.0)
TSH: 1.32 u[IU]/mL (ref 0.450–4.500)

## 2023-11-22 LAB — SURGICAL PATHOLOGY

## 2023-11-28 ENCOUNTER — Other Ambulatory Visit

## 2024-02-13 ENCOUNTER — Other Ambulatory Visit (HOSPITAL_COMMUNITY): Payer: Self-pay
